# Patient Record
Sex: Male | Born: 1975 | Race: Black or African American | Hispanic: No | Marital: Single | Smoking: Current every day smoker
Health system: Southern US, Community
[De-identification: ages and names within clinical notes are randomized; demographics above are authoritative.]

## PROBLEM LIST (undated history)

## (undated) DIAGNOSIS — G4730 Sleep apnea, unspecified: Secondary | ICD-10-CM

## (undated) DIAGNOSIS — F200 Paranoid schizophrenia: Secondary | ICD-10-CM

## (undated) DIAGNOSIS — I10 Essential (primary) hypertension: Secondary | ICD-10-CM

## (undated) DIAGNOSIS — F1910 Other psychoactive substance abuse, uncomplicated: Secondary | ICD-10-CM

## (undated) DIAGNOSIS — K219 Gastro-esophageal reflux disease without esophagitis: Secondary | ICD-10-CM

## (undated) DIAGNOSIS — R7303 Prediabetes: Secondary | ICD-10-CM

## (undated) DIAGNOSIS — E7800 Pure hypercholesterolemia, unspecified: Secondary | ICD-10-CM

## (undated) DIAGNOSIS — E119 Type 2 diabetes mellitus without complications: Secondary | ICD-10-CM

## (undated) HISTORY — DX: Gastro-esophageal reflux disease without esophagitis: K21.9

## (undated) HISTORY — DX: Other psychoactive substance abuse, uncomplicated: F19.10

## (undated) HISTORY — DX: Type 2 diabetes mellitus without complications: E11.9

---

## 2007-04-03 ENCOUNTER — Emergency Department (HOSPITAL_COMMUNITY): Admission: EM | Admit: 2007-04-03 | Discharge: 2007-04-04 | Payer: Self-pay | Admitting: Emergency Medicine

## 2009-11-01 ENCOUNTER — Emergency Department (HOSPITAL_COMMUNITY): Admission: EM | Admit: 2009-11-01 | Discharge: 2009-11-01 | Payer: Self-pay | Admitting: Emergency Medicine

## 2011-11-09 ENCOUNTER — Other Ambulatory Visit: Payer: Self-pay

## 2013-09-12 ENCOUNTER — Emergency Department (HOSPITAL_COMMUNITY): Payer: Medicaid Other

## 2013-09-12 ENCOUNTER — Emergency Department (HOSPITAL_COMMUNITY)
Admission: EM | Admit: 2013-09-12 | Discharge: 2013-09-12 | Disposition: A | Discharge status: Home / Self Care | Payer: Medicaid Other | Attending: Emergency Medicine | Admitting: Emergency Medicine

## 2013-09-12 ENCOUNTER — Encounter (HOSPITAL_COMMUNITY): Payer: Self-pay | Admitting: Emergency Medicine

## 2013-09-12 DIAGNOSIS — I10 Essential (primary) hypertension: Secondary | ICD-10-CM | POA: Insufficient documentation

## 2013-09-12 DIAGNOSIS — Z79899 Other long term (current) drug therapy: Secondary | ICD-10-CM | POA: Insufficient documentation

## 2013-09-12 DIAGNOSIS — M549 Dorsalgia, unspecified: Secondary | ICD-10-CM | POA: Insufficient documentation

## 2013-09-12 DIAGNOSIS — E7800 Pure hypercholesterolemia, unspecified: Secondary | ICD-10-CM | POA: Insufficient documentation

## 2013-09-12 DIAGNOSIS — J209 Acute bronchitis, unspecified: Secondary | ICD-10-CM | POA: Insufficient documentation

## 2013-09-12 DIAGNOSIS — F200 Paranoid schizophrenia: Secondary | ICD-10-CM | POA: Insufficient documentation

## 2013-09-12 DIAGNOSIS — IMO0002 Reserved for concepts with insufficient information to code with codable children: Secondary | ICD-10-CM | POA: Insufficient documentation

## 2013-09-12 DIAGNOSIS — R911 Solitary pulmonary nodule: Secondary | ICD-10-CM

## 2013-09-12 DIAGNOSIS — F17200 Nicotine dependence, unspecified, uncomplicated: Secondary | ICD-10-CM | POA: Insufficient documentation

## 2013-09-12 DIAGNOSIS — Z8669 Personal history of other diseases of the nervous system and sense organs: Secondary | ICD-10-CM | POA: Insufficient documentation

## 2013-09-12 DIAGNOSIS — J40 Bronchitis, not specified as acute or chronic: Secondary | ICD-10-CM

## 2013-09-12 HISTORY — DX: Sleep apnea, unspecified: G47.30

## 2013-09-12 HISTORY — DX: Essential (primary) hypertension: I10

## 2013-09-12 HISTORY — DX: Pure hypercholesterolemia, unspecified: E78.00

## 2013-09-12 HISTORY — DX: Paranoid schizophrenia: F20.0

## 2013-09-12 HISTORY — DX: Prediabetes: R73.03

## 2013-09-12 LAB — CBC WITH DIFFERENTIAL/PLATELET
BASOS ABS: 0 10*3/uL (ref 0.0–0.1)
Basophils Relative: 0 % (ref 0–1)
EOS PCT: 2 % (ref 0–5)
Eosinophils Absolute: 0.3 10*3/uL (ref 0.0–0.7)
HEMATOCRIT: 45.6 % (ref 39.0–52.0)
Hemoglobin: 14.8 g/dL (ref 13.0–17.0)
LYMPHS ABS: 3.4 10*3/uL (ref 0.7–4.0)
LYMPHS PCT: 25 % (ref 12–46)
MCH: 27.7 pg (ref 26.0–34.0)
MCHC: 32.5 g/dL (ref 30.0–36.0)
MCV: 85.4 fL (ref 78.0–100.0)
MONOS PCT: 6 % (ref 3–12)
Monocytes Absolute: 0.8 10*3/uL (ref 0.1–1.0)
NEUTROS ABS: 9.1 10*3/uL — AB (ref 1.7–7.7)
Neutrophils Relative %: 67 % (ref 43–77)
Platelets: 317 10*3/uL (ref 150–400)
RBC: 5.34 MIL/uL (ref 4.22–5.81)
RDW: 14.1 % (ref 11.5–15.5)
WBC: 13.6 10*3/uL — AB (ref 4.0–10.5)

## 2013-09-12 LAB — BASIC METABOLIC PANEL
BUN: 16 mg/dL (ref 6–23)
CALCIUM: 10.4 mg/dL (ref 8.4–10.5)
CO2: 27 meq/L (ref 19–32)
Chloride: 102 mEq/L (ref 96–112)
Creatinine, Ser: 1.1 mg/dL (ref 0.50–1.35)
GFR calc Af Amer: >90 mL/min (ref >90)
GFR, EST NON AFRICAN AMERICAN: 83 mL/min — AB (ref >90)
Glucose, Bld: 89 mg/dL (ref 70–99)
POTASSIUM: 4.2 meq/L (ref 3.7–5.3)
SODIUM: 142 meq/L (ref 137–147)

## 2013-09-12 LAB — D-DIMER, QUANTITATIVE (NOT AT ARMC): D DIMER QUANT: 2.07 ug{FEU}/mL — AB (ref 0.00–0.48)

## 2013-09-12 MED ORDER — SODIUM CHLORIDE 0.9 % IJ SOLN
INTRAMUSCULAR | Status: AC
  Filled 2013-09-12: qty 250

## 2013-09-12 MED ORDER — PREDNISONE 20 MG PO TABS: ORAL_TABLET | ORAL | Status: DC

## 2013-09-12 MED ORDER — AZITHROMYCIN 250 MG PO TABS: 250.0000 mg | ORAL_TABLET | Freq: Every day | ORAL | Status: DC

## 2013-09-12 MED ORDER — IOHEXOL 350 MG/ML SOLN
100.0000 mL | Freq: Once | INTRAVENOUS | Status: AC | PRN
  Administered 2013-09-12: 100 mL via INTRAVENOUS

## 2013-09-12 NOTE — Discharge Instructions (Signed)
Please call your doctor for a followup appointment within 24-48 hours. When you talk to your doctor please let them know that you were seen in the emergency department and have them acquire all of your records so that they can discuss the findings with you and formulate a treatment plan to fully care for your new and ongoing problems. Please take medications as prescribed - azithromycin is an antibiotic and prednisone is a steroid to aid in the reduction of swelling. The blood in your phlegm is most likely from bronchitis - inflammation of the airways of your lungs.  Please continue to rest and stay hydrated.  On CT chest there was a small nodule, 6 mm, identified in the left upper lobe - highly recommend to discuss this with your primary care provider. Highly recommend to follow-up with pulmonologist regarding this finding and for a CT/MR to be repeated within 6-12 months to see if the nodule has changed in size Please try to quit smoking Please continue to monitor symptoms closely and if symptoms are to worsen or change (fever greater than 101, chills, neck pain, chest pain, shortness of breath, difficulty breathing, stomach pain, nausea, vomiting, changes to colors of the sputum/phlegm, mucus becomes bright red with every cough, changes to voice, headache, confusion, blurred vision) please report back to emergency department immediately  Bronchitis Bronchitis is inflammation of the airways that extend from the windpipe into the lungs (bronchi). The inflammation often causes mucus to develop, which leads to a cough. If the inflammation becomes severe, it may cause shortness of breath. CAUSES  Bronchitis may be caused by:   Viral infections.   Bacteria.   Cigarette smoke.   Allergens, pollutants, and other irritants.  SIGNS AND SYMPTOMS  The most common symptom of bronchitis is a frequent cough that produces mucus. Other symptoms include:  Fever.   Body aches.   Chest congestion.    Chills.   Shortness of breath.   Sore throat.  DIAGNOSIS  Bronchitis is usually diagnosed through a medical history and physical exam. Tests, such as chest X-rays, are sometimes done to rule out other conditions.  TREATMENT  You may need to avoid contact with whatever caused the problem (smoking, for example). Medicines are sometimes needed. These may include:  Antibiotics. These may be prescribed if the condition is caused by bacteria.  Cough suppressants. These may be prescribed for relief of cough symptoms.   Inhaled medicines. These may be prescribed to help open your airways and make it easier for you to breathe.   Steroid medicines. These may be prescribed for those with recurrent (chronic) bronchitis. HOME CARE INSTRUCTIONS  Get plenty of rest.   Drink enough fluids to keep your urine clear or pale yellow (unless you have a medical condition that requires fluid restriction). Increasing fluids may help thin your secretions and will prevent dehydration.   Only take over-the-counter or prescription medicines as directed by your health care provider.  Only take antibiotics as directed. Make sure you finish them even if you start to feel better.  Avoid secondhand smoke, irritating chemicals, and strong fumes. These will make bronchitis worse. If you are a smoker, quit smoking. Consider using nicotine gum or skin patches to help control withdrawal symptoms. Quitting smoking will help your lungs heal faster.   Put a cool-mist humidifier in your bedroom at night to moisten the air. This may help loosen mucus. Change the water in the humidifier daily. You can also run the hot water in your  shower and sit in the bathroom with the door closed for 5 10 minutes.   Follow up with your health care provider as directed.   Wash your hands frequently to avoid catching bronchitis again or spreading an infection to others.  SEEK MEDICAL CARE IF: Your symptoms do not improve  after 1 week of treatment.  SEEK IMMEDIATE MEDICAL CARE IF:  Your fever increases.  You have chills.   You have chest pain.   You have worsening shortness of breath.   You have bloody sputum.  You faint.  You have lightheadedness.  You have a severe headache.   You vomit repeatedly. MAKE SURE YOU:   Understand these instructions.  Will watch your condition.  Will get help right away if you are not doing well or get worse. Document Released: 08/10/2005 Document Revised: 05/31/2013 Document Reviewed: 04/04/2013 [facility] Patient Information 2014 [place].  Pulmonary Nodule A pulmonary nodule is a small, round growth of tissue in the lung. Pulmonary nodules can range in size from less than 1/5 inch (4 mm) to a little bigger than an inch (25 mm). Most pulmonary nodules are detected when imaging tests of the lung are being performed for a different problem. Pulmonary nodules are usually not cancerous (benign). However, some pulmonary nodules are cancerous (malignant). Follow-up treatment or testing is based on the size of the pulmonary nodule and your risk of getting lung cancer.  CAUSES Benign pulmonary nodules can be caused by various things. Some of the causes include:   Bacterial, fungal, or viral infections. This is usually an old infection that is no longer active, but it can sometimes be a current, active infection.  A benign mass of tissue.  Inflammation from conditions such as rheumatoid arthritis.   Abnormal blood vessels in the lungs. Malignant pulmonary nodules can result from lung cancer or from cancers that spread to the lung from other places in the body. SIGNS AND SYMPTOMS Pulmonary nodules usually do not cause symptoms. DIAGNOSIS Most often, pulmonary nodules are found incidentally when an X-ray or CT scan is performed to look for some other problem in the lung area. To help determine whether a pulmonary nodule is benign or malignant, your health  care provider will take a medical history and order a variety of tests. Tests done may include:   Blood tests.  A skin test called a tuberculin test. This test is used to determine if you have been exposed to the germ that causes tuberculosis.   Chest X-rays. If possible, a new X-ray may be compared with X-rays you have had in the past.   CT scan. This test shows smaller pulmonary nodules more clearly than an X-ray.   Positron emission tomography (PET) scan. In this test, a safe amount of a radioactive substance is injected into the bloodstream. Then, the scan takes a picture of the pulmonary nodule. The radioactive substance is eliminated from your body in your urine.   Biopsy. A tiny piece of the pulmonary nodule is removed so it can be checked under a microscope. TREATMENT  Pulmonary nodules that are benign normally do not require any treatment because they usually do not cause symptoms or breathing problems. Your health care provider may want to monitor the pulmonary nodule through follow-up CT scans. The frequency of these CT scans will vary based on the size of the nodule and the risk factors for lung cancer. For example, CT scans will need to be done more frequently if the pulmonary nodule is  larger and if you have a history of smoking and a family history of cancer. Further testing or biopsies may be done if any follow-up CT scan shows that the size of the pulmonary nodule has increased. HOME CARE INSTRUCTIONS  Only take over-the-counter or prescription medicines as directed by your health care provider.  Keep all follow-up appointments with your health care provider. SEEK MEDICAL CARE IF:  You have trouble breathing when you are active.   You feel sick or unusually tired.   You do not feel like eating.   You lose weight without trying to.   You develop chills or night sweats.  SEEK IMMEDIATE MEDICAL CARE IF:  You cannot catch your breath, or you begin wheezing.    You cannot stop coughing.   You cough up blood.   You become dizzy or feel like you are going to pass out.   You have sudden chest pain.   You have a fever or persistent symptoms for more than 2 3 days.   You have a fever and your symptoms suddenly get worse. MAKE SURE YOU:  Understand these instructions.  Will watch your condition.  Will get help right away if you are not doing well or get worse. Document Released: 06/07/2009 Document Revised: 04/12/2013 Document Reviewed: 01/30/2013 [facility] Patient Information 2014 [place].  RESOURCE GUIDE  Chronic Pain Problems:  Contact [facility] [phone]  Patients need to be referred by their primary care doctor.  Insufficient Money for Medicine:  Contact [employer]: call "211" or [place] [phone].  No Primary Care Doctor:    Call [facility] [phone] - can help you locate a primary care doctor that accepts your insurance, provides certain services, etc.    Physician Referral Service- [phone] Agencies that provide inexpensive medical care:    [facility] [phone]    [facility] [phone]    [facility] [phone]    [facility] [phone]    [facility] [phone] [facility] Primary Care Doctor List  Sinda Du MD. Specialty: Pulmonary Disease Contact information: [place]  [place] [address]  [phone]   Tula Nakayama, MD. Specialty: Family Medicine Contact information: [address]  [place] [address]  [phone]   Sallee Lange, MD. Specialty: Family Medicine Contact information: [place]  [place] [address]  [phone]   Rosita Fire, MD Specialty: Internal Medicine Contact information: [place] [address]  [phone]   Delphina Cahill, MD. Specialty: Internal Medicine  Contact information: [place] [address]  [phone]   Marjean Donna, MD. Specialty: Family Medicine Contact information: [place] [address]  [phone]   Leslie Andrea, MD. Specialty: [facility] Contact information: [place] [address]  [phone]   Asencion Noble, MD. Specialty: Internal Medicine Contact information: [address]  [place] [address]  [phone]

## 2013-09-12 NOTE — ED Notes (Signed)
Alert, says has expectorated blood

## 2013-09-12 NOTE — ED Notes (Signed)
Cough for 2 weeks, says he expectorated blood for last 3 days.

## 2013-09-12 NOTE — ED Provider Notes (Signed)
CSN: [id number]     Arrival date & time 09/12/13  1435 History   First MD Initiated Contact with Patient 09/12/13 1505     Chief Complaint  Patient presents with  . Cough   (Consider location/radiation/quality/duration/timing/severity/associated sxs/prior Treatment) The history is provided by the patient. No language interpreter was used.  Daniel Arias is a 38 year old male with past medical history of hypertension, hypercholesterolemia, paranoid schizophrenia presenting to the emergency department with cough, nasal congestion, myalgias the been ongoing for the past 2 weeks. Patient reported that his cough started out as a clear phlegm and has now turned into a "normal red" tinge. Patient reports he notices this red tinge phlegm mainly in the mornings and late at night. Patient reported that he has been having sweats-reported that it is been continuous for the past couple of nights, thinks this is from his new comforter. Reported generalized bodyaches, localized to the lower back described as an aching sensation that is constant. Patient reported that his little sister is sick as well with cold-like symptoms. Denied nausea, vomiting, diarrhea, abdominal pain, neck pain, neck stiffness, blurred vision, sudden loss of vision, dizziness, headache, chest pain, shortness of breath, difficulty breathing, fever, sore throat, difficulty swallowing, melena, hematochezia, urinary symptoms, hematuria, ear pain, eye complaints. PCP Dr. Octavia Bruckner  Past Medical History  Diagnosis Date  . Hypertension   . Hypercholesteremia   . Schizophrenia, paranoid   . Prediabetes   . Sleep apnea    History reviewed. No pertinent past surgical history. History reviewed. No pertinent family history. History  Substance Use Topics  . Smoking status: Current Every Day Smoker  . Smokeless tobacco: Not on file  . Alcohol Use: Yes    Review of Systems  Constitutional: Positive for fever and chills.  HENT: Negative for sore  throat and trouble swallowing.   Eyes: Negative for visual disturbance.  Respiratory: Positive for cough. Negative for chest tightness and shortness of breath.   Cardiovascular: Negative for chest pain.  Gastrointestinal: Negative for nausea, vomiting, abdominal pain, diarrhea, constipation and blood in stool.  Musculoskeletal: Positive for back pain and myalgias. Negative for neck pain.  Neurological: Negative for dizziness, weakness, numbness and headaches.  All other systems reviewed and are negative.    Allergies  Review of patient's allergies indicates no known allergies.  Home Medications   Current Outpatient Rx  Name  Route  Sig  Dispense  Refill  . ARIPiprazole (ABILIFY) 30 MG tablet   Oral   Take 30 mg by mouth daily.         . fenofibrate (TRICOR) 145 MG tablet   Oral   Take 145 mg by mouth daily.         Marland Kitchen lisinopril (PRINIVIL,ZESTRIL) 40 MG tablet   Oral   Take 40 mg by mouth daily.         . Omega-3 Fatty Acids (FISH OIL) 1000 MG CAPS   Oral   Take 1 capsule by mouth daily.         Marland Kitchen azithromycin (ZITHROMAX) 250 MG tablet   Oral   Take 1 tablet (250 mg total) by mouth daily. Take first 2 tablets together, then 1 every day until finished.   6 tablet   0   . predniSONE (DELTASONE) 20 MG tablet      3 tabs po day one, then 2 tabs daily x 4 days   11 tablet   0    BP 110/64  Pulse 107  Temp(Src)  99 F (37.2 C) (Oral)  Resp 20  Ht 6' 2"  (1.88 m)  Wt 312 lb (141.522 kg)  BMI 40.04 kg/m2  SpO2 95% Physical Exam  Nursing note and vitals reviewed. Constitutional: He is oriented to person, place, and time. He appears well-developed and well-nourished. No distress.  HENT:  Head: Normocephalic and atraumatic.  Mouth/Throat: Oropharynx is clear and moist. No oropharyngeal exudate.  Negative swelling, erythema, inflammation, lesions, sores, exudate, petechiae noted to the posterior oropharynx and tonsils bilaterally. Uvula midline, symmetrical  elevation. Negative uvula swelling  Eyes: Conjunctivae and EOM are normal. Pupils are equal, round, and reactive to light. Right eye exhibits no discharge. Left eye exhibits no discharge.  Neck: Normal range of motion. Neck supple. No tracheal deviation present.  Cardiovascular: Normal rate, regular rhythm and normal heart sounds.  Exam reveals no friction rub.   No murmur heard. Cap refill less than 3 seconds Negative swelling localized to the lower extremities Negative pitting edema identified  Pulmonary/Chest: Effort normal and breath sounds normal. No respiratory distress. He has no wheezes. He has no rales.  Airway intact Negative stridor Negative use of accessory muscles Patient stable speaking full sentences without difficulty  Musculoskeletal: Normal range of motion.  Full ROM to upper and lower extremities without difficulty noted, negative ataxia noted.  Lymphadenopathy:    He has no cervical adenopathy.  Neurological: He is alert and oriented to person, place, and time. No cranial nerve deficit. He exhibits normal muscle tone. Coordination normal.  Cranial nerves III-XII grossly intact  Skin: Skin is warm and dry. No rash noted. He is not diaphoretic. No erythema.  Psychiatric:  Flat affect    ED Course  Procedures (including critical care time)  6:13 PM This provider had a long discussion with the patient regarding labs and imaging results. Discussed with the patient importance of smoking cessation. Discussed with the patient the importance of following up with his primary care provider and pulmonologist. Discussed with patient the need for a CT/MR to be performed within 6-12 months to identify the status of the nodule.   Results for orders placed during the hospital encounter of 09/12/13  CBC WITH DIFFERENTIAL      Result Value Range   WBC 13.6 (*) 4.0 - 10.5 K/uL   RBC 5.34  4.22 - 5.81 MIL/uL   Hemoglobin 14.8  13.0 - 17.0 g/dL   HCT 45.6  39.0 - 52.0 %   MCV 85.4   78.0 - 100.0 fL   MCH 27.7  26.0 - 34.0 pg   MCHC 32.5  30.0 - 36.0 g/dL   RDW 14.1  11.5 - 15.5 %   Platelets 317  150 - 400 K/uL   Neutrophils Relative % 67  43 - 77 %   Lymphocytes Relative 25  12 - 46 %   Monocytes Relative 6  3 - 12 %   Eosinophils Relative 2  0 - 5 %   Basophils Relative 0  0 - 1 %   Neutro Abs 9.1 (*) 1.7 - 7.7 K/uL   Lymphs Abs 3.4  0.7 - 4.0 K/uL   Monocytes Absolute 0.8  0.1 - 1.0 K/uL   Eosinophils Absolute 0.3  0.0 - 0.7 K/uL   Basophils Absolute 0.0  0.0 - 0.1 K/uL   RBC Morphology POLYCHROMASIA PRESENT     WBC Morphology ATYPICAL LYMPHOCYTES     Smear Review LARGE PLATELETS PRESENT    BASIC METABOLIC PANEL      Result Value  Range   Sodium 142  137 - 147 mEq/L   Potassium 4.2  3.7 - 5.3 mEq/L   Chloride 102  96 - 112 mEq/L   CO2 27  19 - 32 mEq/L   Glucose, Bld 89  70 - 99 mg/dL   BUN 16  6 - 23 mg/dL   Creatinine, Ser 1.10  0.50 - 1.35 mg/dL   Calcium 10.4  8.4 - 10.5 mg/dL   GFR calc non Af Amer 83 (*) >90 mL/min   GFR calc Af Amer >90  >90 mL/min  D-DIMER, QUANTITATIVE      Result Value Range   D-Dimer, Quant 2.07 (*) 0.00 - 0.48 ug/mL-FEU   Dg Chest 2 View  09/12/2013   CLINICAL DATA:  Cough and hemoptysis  EXAM: CHEST  2 VIEW  COMPARISON:  None.  FINDINGS: Lungs are clear. Heart size and pulmonary vascularity are normal. No adenopathy. No bone lesions.  IMPRESSION: No abnormality noted. If hemoptysis persists, correlation with chest CT or bronchoscopy may be reasonable.   Electronically Signed   By: Lowella Grip M.D.   On: 09/12/2013 15:50   Ct Angio Chest Pe W/cm &/or Wo Cm  09/12/2013   CLINICAL DATA:  Coughing up blood, cough  EXAM: CT ANGIOGRAPHY CHEST WITH CONTRAST  TECHNIQUE: Multidetector CT imaging of the chest was performed using the standard protocol during bolus administration of intravenous contrast. Multiplanar CT image reconstructions including MIPs were obtained to evaluate the vascular anatomy.  CONTRAST:  134m OMNIPAQUE  IOHEXOL 350 MG/ML SOLN  COMPARISON:  Prior radiograph from earlier the same day and  FINDINGS: No pathologically enlarged mediastinal, hilar, or axillary lymph nodes are identified.  The aorta and great vessels demonstrate a normal contrast enhanced appearance. Heart size is within normal limits. Trace pericardial fluid noted.  Pulmonary arterial tree is well opacified. No filling defect to suggest acute pulmonary embolism identified. Re-formatted imaging confirms these findings.  Lungs are clear without focal infiltrate, pulmonary edema, or pleural effusion. There is no pneumothorax. A 6 mm nodule is present along the major fissure in the posterior left upper lobe (series 5, image 51).  Visualized portions of the upper abdomen are unremarkable.  No focal lytic or blastic osseous lesion identified. No acute osseous abnormality.  IMPRESSION: 1. No CT evidence of acute pulmonary embolism. 2. 6 mm nodule along the left major fissure an down left upper lobe. If the patient is at high risk for bronchogenic carcinoma, follow-up chest CT at 6-12 months is recommended. If the patient is at low risk for bronchogenic carcinoma, follow-up chest CT at 12 months is recommended. This recommendation follows the consensus statement: Guidelines for Management of Small Pulmonary Nodules Detected on CT Scans: A Statement from the [place] published in Radiology 2005;237:395-400.   Electronically Signed   By: BJeannine BogaM.D.   On: 09/12/2013 17:44   Labs Review Labs Reviewed  CBC WITH DIFFERENTIAL - Abnormal; Notable for the following:    WBC 13.6 (*)    Neutro Abs 9.1 (*)    All other components within normal limits  BASIC METABOLIC PANEL - Abnormal; Notable for the following:    GFR calc non Af Amer 83 (*)    All other components within normal limits  D-DIMER, QUANTITATIVE - Abnormal; Notable for the following:    D-Dimer, Quant 2.07 (*)    All other components within normal limits   Imaging  Review Dg Chest 2 View  09/12/2013   CLINICAL DATA:  Cough and hemoptysis  EXAM: CHEST  2 VIEW  COMPARISON:  None.  FINDINGS: Lungs are clear. Heart size and pulmonary vascularity are normal. No adenopathy. No bone lesions.  IMPRESSION: No abnormality noted. If hemoptysis persists, correlation with chest CT or bronchoscopy may be reasonable.   Electronically Signed   By: Lowella Grip M.D.   On: 09/12/2013 15:50   Ct Angio Chest Pe W/cm &/or Wo Cm  09/12/2013   CLINICAL DATA:  Coughing up blood, cough  EXAM: CT ANGIOGRAPHY CHEST WITH CONTRAST  TECHNIQUE: Multidetector CT imaging of the chest was performed using the standard protocol during bolus administration of intravenous contrast. Multiplanar CT image reconstructions including MIPs were obtained to evaluate the vascular anatomy.  CONTRAST:  135m OMNIPAQUE IOHEXOL 350 MG/ML SOLN  COMPARISON:  Prior radiograph from earlier the same day and  FINDINGS: No pathologically enlarged mediastinal, hilar, or axillary lymph nodes are identified.  The aorta and great vessels demonstrate a normal contrast enhanced appearance. Heart size is within normal limits. Trace pericardial fluid noted.  Pulmonary arterial tree is well opacified. No filling defect to suggest acute pulmonary embolism identified. Re-formatted imaging confirms these findings.  Lungs are clear without focal infiltrate, pulmonary edema, or pleural effusion. There is no pneumothorax. A 6 mm nodule is present along the major fissure in the posterior left upper lobe (series 5, image 51).  Visualized portions of the upper abdomen are unremarkable.  No focal lytic or blastic osseous lesion identified. No acute osseous abnormality.  IMPRESSION: 1. No CT evidence of acute pulmonary embolism. 2. 6 mm nodule along the left major fissure an down left upper lobe. If the patient is at high risk for bronchogenic carcinoma, follow-up chest CT at 6-12 months is recommended. If the patient is at low risk for  bronchogenic carcinoma, follow-up chest CT at 12 months is recommended. This recommendation follows the consensus statement: Guidelines for Management of Small Pulmonary Nodules Detected on CT Scans: A Statement from the [place] published in Radiology 2005;237:395-400.   Electronically Signed   By: BJeannine BogaM.D.   On: 09/12/2013 17:44    EKG Interpretation   None       MDM   1. Bronchitis   2. Lung nodule seen on imaging study     Medications  sodium chloride 0.9 % injection (not administered)  iohexol (OMNIPAQUE) 350 MG/ML injection 100 mL (100 mLs Intravenous Contrast Given 09/12/13 1717)   Filed Vitals:   09/12/13 1454  BP: 110/64  Pulse: 107  Temp: 99 F (37.2 C)  TempSrc: Oral  Resp: 20  Height: 6' 2"  (1.88 m)  Weight: 312 lb (141.522 kg)  SpO2: 95%    Patient presents to the emergency department with a cough that has been ongoing for the past 2 weeks. Patient reported that over the past 2 days-since Friday he has noticed that the phlegm is another red tinged color. Reported that the red tinged phlegm mainly occurs early in the morning and late at night. Reported associated symptoms of sweating mainly at night-reported that he does not have to change his clothing. Alert and oriented. GCS 15. Heart rate and rhythm normal. Lungs clear to auscultation. Negative neck stiffness. Negative nuchal rigidity. Negative cervical lymphadenopathy. Cap refill less than 3 seconds. Radial pulses and DP pulses 2+ bilaterally. Negative leg swelling or pitting edema identified bilaterally. Negative meningeal signs. Full range of motion to upper and lower extremities bilaterally without difficulty noted or ataxia. Strength intact. Sensation intact.  CBC noted mild elevation of white blood cell count-13.6 with negative leukocytosis identified. BMP negative findings. D-dimer elevated at 2.07. Chest x-ray negative abnormalities identified. CT angio chest negative evidence of  acute pulmonary embolism. On CT of chest 6 mm nodule along the left major fissure of left upper lobe identified-recommended CT/MR to be performed at 6-12 months. Negative findings of PE. Doubt tuberculosis. Suspicion to be possible bronchitis-cannot rule out possible beginnings of bronchogenic carcinoma. Discussed lab findings and imaging with patient in great detail. Discussed with patient and referred patient to primary care provider to be reassessed within 24-48 hours. Referred patient to primary care provider and pulmonologist. Discharged patient azithromycin and prednisone.  Discussed with patient to rest and stay hydrated. Discussed with patient to closely monitor symptoms and if symptoms are to worsen or change to report back to the ED - strict return instructions given.  Patient agreed to plan of care, understood, all questions answered.   Jamse Mead, PA-C 09/13/13 2010

## 2013-09-14 NOTE — ED Provider Notes (Signed)
Medical screening examination/treatment/procedure(s) were performed by non-physician practitioner and as supervising physician I was immediately available for consultation/collaboration.  EKG Interpretation   None         Sharyon Cable, MD 09/14/13 [phone]

## 2014-03-04 ENCOUNTER — Encounter (HOSPITAL_COMMUNITY): Payer: Self-pay | Admitting: Emergency Medicine

## 2014-03-04 ENCOUNTER — Emergency Department (HOSPITAL_COMMUNITY): Payer: Medicaid Other

## 2014-03-04 ENCOUNTER — Emergency Department (HOSPITAL_COMMUNITY)
Admission: EM | Admit: 2014-03-04 | Discharge: 2014-03-04 | Disposition: A | Discharge status: Home / Self Care | Payer: Medicaid Other | Attending: Emergency Medicine | Admitting: Emergency Medicine

## 2014-03-04 DIAGNOSIS — S93409A Sprain of unspecified ligament of unspecified ankle, initial encounter: Secondary | ICD-10-CM | POA: Insufficient documentation

## 2014-03-04 DIAGNOSIS — F200 Paranoid schizophrenia: Secondary | ICD-10-CM | POA: Diagnosis not present

## 2014-03-04 DIAGNOSIS — Y9239 Other specified sports and athletic area as the place of occurrence of the external cause: Secondary | ICD-10-CM | POA: Diagnosis not present

## 2014-03-04 DIAGNOSIS — E7800 Pure hypercholesterolemia, unspecified: Secondary | ICD-10-CM | POA: Insufficient documentation

## 2014-03-04 DIAGNOSIS — S99919A Unspecified injury of unspecified ankle, initial encounter: Secondary | ICD-10-CM | POA: Diagnosis present

## 2014-03-04 DIAGNOSIS — Y9364 Activity, baseball: Secondary | ICD-10-CM | POA: Insufficient documentation

## 2014-03-04 DIAGNOSIS — Y92838 Other recreation area as the place of occurrence of the external cause: Secondary | ICD-10-CM | POA: Diagnosis not present

## 2014-03-04 DIAGNOSIS — S93401A Sprain of unspecified ligament of right ankle, initial encounter: Secondary | ICD-10-CM

## 2014-03-04 DIAGNOSIS — F17200 Nicotine dependence, unspecified, uncomplicated: Secondary | ICD-10-CM | POA: Diagnosis not present

## 2014-03-04 DIAGNOSIS — Z79899 Other long term (current) drug therapy: Secondary | ICD-10-CM | POA: Diagnosis not present

## 2014-03-04 DIAGNOSIS — I10 Essential (primary) hypertension: Secondary | ICD-10-CM | POA: Insufficient documentation

## 2014-03-04 DIAGNOSIS — S8990XA Unspecified injury of unspecified lower leg, initial encounter: Secondary | ICD-10-CM | POA: Diagnosis present

## 2014-03-04 DIAGNOSIS — X58XXXA Exposure to other specified factors, initial encounter: Secondary | ICD-10-CM | POA: Insufficient documentation

## 2014-03-04 MED ORDER — NAPROXEN 500 MG PO TABS: 500.0000 mg | ORAL_TABLET | Freq: Two times a day (BID) | ORAL | Status: DC

## 2014-03-04 MED ORDER — HYDROCODONE-ACETAMINOPHEN 5-325 MG PO TABS: ORAL_TABLET | ORAL | Status: DC

## 2014-03-04 NOTE — ED Notes (Signed)
Patient verbalizes understanding of discharge instructions, prescription medications, home care, and follow up information. Patient ambulatory out of department at this time

## 2014-03-04 NOTE — ED Notes (Signed)
Patient complaining of pain to lateral right ankle. Pt states he was playing softball yesterday and hurt it. + pedal pulse to affected limb.

## 2014-03-04 NOTE — ED Notes (Signed)
Playing softball yesterday and states ankle or arch to right foot began hurting. Constant pain when walking. No pain when off his feet.

## 2014-03-04 NOTE — Discharge Instructions (Signed)
Ankle Sprain An ankle sprain is an injury to the strong, fibrous tissues (ligaments) that hold your ankle bones together.  HOME CARE   Put ice on your ankle for 1-2 days or as told by your doctor.  Put ice in a plastic bag.  Place a towel between your skin and the bag.  Leave the ice on for 15-20 minutes at a time, every 2 hours while you are awake.  Only take medicine as told by your doctor.  Raise (elevate) your injured ankle above the level of your heart as much as possible for 2-3 days.  Use crutches if your doctor tells you to. Slowly put your own weight on the affected ankle. Use the crutches until you can walk without pain.  If you have a plaster splint:  Do not rest it on anything harder than a pillow for 24 hours.  Do not put weight on it.  Do not get it wet.  Take it off to shower or bathe.  If given, use an elastic wrap or support stocking for support. Take the wrap off if your toes lose feeling (numb), tingle, or turn cold or blue.  If you have an air splint:  Add or let out air to make it comfortable.  Take it off at night and to shower and bathe.  Wiggle your toes and move your ankle up and down often while you are wearing it. GET HELP RIGHT AWAY IF:   Your toes lose feeling (numb) or turn blue.  You have severe pain that is increasing.  You have rapidly increasing bruising or puffiness (swelling).  Your toes feel very cold.  You lose feeling in your foot.  Your medicine does not help your pain. MAKE SURE YOU:   Understand these instructions.  Will watch your condition.  Will get help right away if you are not doing well or get worse. Document Released: 01/27/2008 Document Revised: 05/04/2012 Document Reviewed: 02/22/2012 [facility] Patient Information 2015 [place], [place]. This information is not intended to replace advice given to you by your health care provider. Make sure you discuss any questions you have with your health care provider.

## 2014-03-07 NOTE — ED Provider Notes (Signed)
CSN: [id number]     Arrival date & time 03/04/14  2054 History   First MD Initiated Contact with Patient 03/04/14 2141     Chief Complaint  Patient presents with  . Ankle Pain     (Consider location/radiation/quality/duration/timing/severity/associated sxs/prior Treatment) Patient is a 38 y.o. male presenting with ankle pain. The history is provided by the patient.  Ankle Pain Location:  Ankle Time since incident:  1 day Injury: yes   Mechanism of injury comment:  Twisting Ankle location:  R ankle Pain details:    Quality:  Aching   Radiates to:  Does not radiate   Severity:  Mild   Onset quality:  Sudden   Timing:  Constant   Progression:  Unchanged Chronicity:  New Dislocation: no   Prior injury to area:  No Relieved by:  Rest Worsened by:  Activity and bearing weight Ineffective treatments:  None tried Associated symptoms: no back pain, no decreased ROM, no fever, no itching, no neck pain, no numbness, no stiffness, no swelling and no tingling     Past Medical History  Diagnosis Date  . Hypertension   . Hypercholesteremia   . Schizophrenia, paranoid   . Prediabetes   . Sleep apnea    History reviewed. No pertinent past surgical history. History reviewed. No pertinent family history. History  Substance Use Topics  . Smoking status: Current Every Day Smoker  . Smokeless tobacco: Not on file  . Alcohol Use: Yes    Review of Systems  Constitutional: Negative for fever and chills.  Genitourinary: Negative for dysuria and difficulty urinating.  Musculoskeletal: Positive for arthralgias. Negative for back pain, joint swelling, neck pain and stiffness.       Right ankle pain  Skin: Negative for color change, itching and wound.  All other systems reviewed and are negative.     Allergies  Review of patient's allergies indicates no known allergies.  Home Medications   Prior to Admission medications   Medication Sig Start Date End Date Taking? Authorizing  Provider  ARIPiprazole (ABILIFY) 30 MG tablet Take 30 mg by mouth daily.   Yes Historical Provider, MD  fenofibrate (TRICOR) 145 MG tablet Take 145 mg by mouth daily.   Yes Historical Provider, MD  hydrochlorothiazide (HYDRODIURIL) 25 MG tablet Take 25 mg by mouth daily.   Yes Historical Provider, MD  lisinopril (PRINIVIL,ZESTRIL) 40 MG tablet Take 40 mg by mouth daily.   Yes Historical Provider, MD  HYDROcodone-acetaminophen (NORCO/VICODIN) 5-325 MG per tablet Take one-two tabs po q 4-6 hrs prn pain 03/04/14   Travone Georg L. Kell Ferris, PA-C  naproxen (NAPROSYN) 500 MG tablet Take 1 tablet (500 mg total) by mouth 2 (two) times daily. 03/04/14   Lowry Bala L. Delontae Lamm, PA-C   BP 148/73  Pulse 88  Temp(Src) 98.1 F (36.7 C) (Oral)  Resp 20  Ht 6' 2"  (1.88 m)  Wt 313 lb (141.976 kg)  BMI 40.17 kg/m2  SpO2 97% Physical Exam  Nursing note and vitals reviewed. Constitutional: He is oriented to person, place, and time. He appears well-developed and well-nourished. No distress.  HENT:  Head: Normocephalic and atraumatic.  Cardiovascular: Normal rate, regular rhythm, normal heart sounds and intact distal pulses.   Pulmonary/Chest: Effort normal and breath sounds normal.  Musculoskeletal: He exhibits tenderness. He exhibits no edema.  Right lateral ankle is ttp, no STS is present.  ROM is preserved.  DP pulse is brisk,distal sensation intact.  No erythema, abrasion, bruising or bony deformity.  No proximal tenderness.  Neurological: He is alert and oriented to person, place, and time. He exhibits normal muscle tone. Coordination normal.  Skin: Skin is warm and dry.    ED Course  Procedures (including critical care time) Labs Review Labs Reviewed - No data to display  Imaging Review Dg Foot Complete Right  03/04/2014   CLINICAL DATA:  Injury.  Right foot pain.  EXAM: RIGHT FOOT COMPLETE - 3+ VIEW  COMPARISON:  None.  FINDINGS: Imaged bones, joints and soft tissues appear normal.  IMPRESSION: Negative  exam.   Electronically Signed   By: Inge Rise M.D.   On: 03/04/2014 21:26     EKG Interpretation None      MDM   Final diagnoses:  Ankle sprain, right, initial encounter    Pt with localized ttp of the lateral right foot and distal ankle.  No fx.  ASO applied for comfort.  Pt agrees to RICE therapy and given referral for orthopedics.  Pt stable for d/c    Tekla Malachowski L. Vanessa [place], PA-C 03/07/14 1809

## 2014-03-08 NOTE — ED Provider Notes (Signed)
Medical screening examination/treatment/procedure(s) were performed by non-physician practitioner and as supervising physician I was immediately available for consultation/collaboration.   EKG Interpretation None        Maudry Diego, MD 03/08/14 1616

## 2014-05-14 ENCOUNTER — Encounter (HOSPITAL_COMMUNITY): Payer: Self-pay | Admitting: Emergency Medicine

## 2014-05-14 ENCOUNTER — Emergency Department (HOSPITAL_COMMUNITY)
Admission: EM | Admit: 2014-05-14 | Discharge: 2014-05-15 | Disposition: A | Discharge status: Home / Self Care | Payer: Medicaid Other | Attending: Emergency Medicine | Admitting: Emergency Medicine

## 2014-05-14 DIAGNOSIS — E7800 Pure hypercholesterolemia, unspecified: Secondary | ICD-10-CM | POA: Insufficient documentation

## 2014-05-14 DIAGNOSIS — X58XXXA Exposure to other specified factors, initial encounter: Secondary | ICD-10-CM | POA: Diagnosis not present

## 2014-05-14 DIAGNOSIS — S139XXA Sprain of joints and ligaments of unspecified parts of neck, initial encounter: Secondary | ICD-10-CM | POA: Diagnosis not present

## 2014-05-14 DIAGNOSIS — M542 Cervicalgia: Secondary | ICD-10-CM | POA: Diagnosis present

## 2014-05-14 DIAGNOSIS — Y929 Unspecified place or not applicable: Secondary | ICD-10-CM | POA: Diagnosis not present

## 2014-05-14 DIAGNOSIS — Z8659 Personal history of other mental and behavioral disorders: Secondary | ICD-10-CM | POA: Insufficient documentation

## 2014-05-14 DIAGNOSIS — Z79899 Other long term (current) drug therapy: Secondary | ICD-10-CM | POA: Insufficient documentation

## 2014-05-14 DIAGNOSIS — F17200 Nicotine dependence, unspecified, uncomplicated: Secondary | ICD-10-CM | POA: Diagnosis not present

## 2014-05-14 DIAGNOSIS — I10 Essential (primary) hypertension: Secondary | ICD-10-CM | POA: Diagnosis not present

## 2014-05-14 DIAGNOSIS — Z791 Long term (current) use of non-steroidal anti-inflammatories (NSAID): Secondary | ICD-10-CM | POA: Insufficient documentation

## 2014-05-14 DIAGNOSIS — Y939 Activity, unspecified: Secondary | ICD-10-CM | POA: Diagnosis not present

## 2014-05-14 DIAGNOSIS — S161XXA Strain of muscle, fascia and tendon at neck level, initial encounter: Secondary | ICD-10-CM

## 2014-05-14 MED ORDER — KETOROLAC TROMETHAMINE 60 MG/2ML IM SOLN
60.0000 mg | Freq: Once | INTRAMUSCULAR | Status: AC
  Administered 2014-05-14: 60 mg via INTRAMUSCULAR
  Filled 2014-05-14: qty 2

## 2014-05-14 MED ORDER — CYCLOBENZAPRINE HCL 10 MG PO TABS: 10.0000 mg | ORAL_TABLET | Freq: Two times a day (BID) | ORAL | Status: DC | PRN

## 2014-05-14 MED ORDER — IBUPROFEN 600 MG PO TABS: 600.0000 mg | ORAL_TABLET | Freq: Four times a day (QID) | ORAL | Status: DC | PRN

## 2014-05-14 NOTE — ED Notes (Signed)
I have a sharp pain in my neck and in it's in my right foot also per pt

## 2014-05-14 NOTE — ED Provider Notes (Signed)
CSN: [id number]     Arrival date & time 05/14/14  2240 History  This chart was scribed for Daniel Hacker, MD by Evelene Croon, ED Scribe. This patient was seen in room APA17/APA17 and the patient's care was started 11:12 PM.    Chief Complaint  Patient presents with  . Neck Pain   The history is provided by the patient. No language interpreter was used.    HPI Comments:  Daniel DUVA is a 38 y.o. male who presents to the Emergency Department complaining of sharp, shooting 3/10 pain to his right neck that started about 4 days ago. He denies weakness/numbness/tingling to [name] and BLE. Pt denies fever and HA. Also denies exacerbation of pain with head movement. Pt has applied ice with minimal relief.  Has not taken any medication.  Thinks he may have slept on it wrong.   Past Medical History  Diagnosis Date  . Hypertension   . Hypercholesteremia   . Schizophrenia, paranoid   . Prediabetes   . Sleep apnea    History reviewed. No pertinent past surgical history. No family history on file. History  Substance Use Topics  . Smoking status: Current Every Day Smoker  . Smokeless tobacco: Not on file  . Alcohol Use: Yes    Review of Systems  Constitutional: Negative for fever.  Musculoskeletal: Positive for neck pain. Negative for gait problem and neck stiffness.  Neurological: Negative for weakness, numbness and headaches.  All other systems reviewed and are negative.     Allergies  Review of patient's allergies indicates no known allergies.  Home Medications   Prior to Admission medications   Medication Sig Start Date End Date Taking? Authorizing Provider  ARIPiprazole (ABILIFY) 30 MG tablet Take 30 mg by mouth daily.   Yes Historical Provider, MD  fenofibrate (TRICOR) 145 MG tablet Take 145 mg by mouth daily.   Yes Historical Provider, MD  hydrochlorothiazide (HYDRODIURIL) 25 MG tablet Take 25 mg by mouth daily.   Yes Historical Provider, MD  HYDROcodone-acetaminophen  (NORCO/VICODIN) 5-325 MG per tablet Take one-two tabs po q 4-6 hrs prn pain 03/04/14  Yes Tammy L. Triplett, PA-C  lisinopril (PRINIVIL,ZESTRIL) 40 MG tablet Take 40 mg by mouth daily.   Yes Historical Provider, MD  naproxen (NAPROSYN) 500 MG tablet Take 1 tablet (500 mg total) by mouth 2 (two) times daily. 03/04/14  Yes Tammy L. Triplett, PA-C  cyclobenzaprine (FLEXERIL) 10 MG tablet Take 1 tablet (10 mg total) by mouth 2 (two) times daily as needed for muscle spasms. 05/14/14   Daniel Hacker, MD  ibuprofen (ADVIL,MOTRIN) 600 MG tablet Take 1 tablet (600 mg total) by mouth every 6 (six) hours as needed. 05/14/14   Daniel Hacker, MD   BP 181/83  Pulse 90  Temp(Src) 98.3 F (36.8 C) (Oral)  Resp 18  Ht 6' 1"  (1.854 m)  Wt 308 lb (139.708 kg)  BMI 40.64 kg/m2  SpO2 93% Physical Exam  Nursing note and vitals reviewed. Constitutional: He is oriented to person, place, and time. He appears well-developed and well-nourished.  overweight  HENT:  Head: Normocephalic and atraumatic.  Neck: Normal range of motion. Neck supple.  Tenderness to palpation over the insertion of the rhomboid into the occiput, mild spasm noted  Cardiovascular: Normal rate and regular rhythm.   Pulmonary/Chest: Effort normal. No respiratory distress.  Musculoskeletal: He exhibits no edema.  Lymphadenopathy:    He has no cervical adenopathy.  Neurological: He is alert and oriented to  person, place, and time.  5 out of 5 strength 4 extremities  Skin: Skin is warm and dry.  Psychiatric: He has a normal mood and affect.    ED Course  Procedures    Labs Review Labs Reviewed - No data to display  Imaging Review No results found.   EKG Interpretation None      MDM   Final diagnoses:  Cervical strain, initial encounter    Patient presents with pain the right side of his neck. No known injury. Feels he may have slept wrong. Has not taken anything at home but has applied ice with minimal relief. Current  pain is 3/10. Denies headaches or fevers.  Tenderness palpation of the right rhomboid with spasm. Otherwise full range of motion. Nonfocal. Suspect musculoskeletal strain. Discussed with patient anti-inflammatories the patient will be given a short course of muscle relaxants. Patient stated understanding.  After history, exam, and medical workup I feel the patient has been appropriately medically screened and is safe for discharge home. Pertinent diagnoses were discussed with the patient. Patient was given return precautions.   I personally performed the services described in this documentation, which was scribed in my presence. The recorded information has been reviewed and is accurate.     Daniel Hacker, MD 05/14/14 2328

## 2014-05-14 NOTE — Discharge Instructions (Signed)

## 2015-11-23 ENCOUNTER — Emergency Department (HOSPITAL_COMMUNITY)
Admission: EM | Admit: 2015-11-23 | Discharge: 2015-11-23 | Disposition: A | Discharge status: Home / Self Care | Payer: Medicaid Other | Attending: Emergency Medicine | Admitting: Emergency Medicine

## 2015-11-23 ENCOUNTER — Encounter (HOSPITAL_COMMUNITY): Payer: Self-pay | Admitting: *Deleted

## 2015-11-23 DIAGNOSIS — F17210 Nicotine dependence, cigarettes, uncomplicated: Secondary | ICD-10-CM | POA: Diagnosis not present

## 2015-11-23 DIAGNOSIS — F200 Paranoid schizophrenia: Secondary | ICD-10-CM | POA: Diagnosis not present

## 2015-11-23 DIAGNOSIS — L0291 Cutaneous abscess, unspecified: Secondary | ICD-10-CM

## 2015-11-23 DIAGNOSIS — Z79899 Other long term (current) drug therapy: Secondary | ICD-10-CM | POA: Diagnosis not present

## 2015-11-23 DIAGNOSIS — L02415 Cutaneous abscess of right lower limb: Secondary | ICD-10-CM | POA: Insufficient documentation

## 2015-11-23 DIAGNOSIS — I10 Essential (primary) hypertension: Secondary | ICD-10-CM | POA: Diagnosis not present

## 2015-11-23 MED ORDER — SULFAMETHOXAZOLE-TRIMETHOPRIM 800-160 MG PO TABS: 1.0000 | ORAL_TABLET | Freq: Two times a day (BID) | ORAL | Status: AC

## 2015-11-23 MED ORDER — LIDOCAINE HCL (PF) 1 % IJ SOLN
INTRAMUSCULAR | Status: AC
  Administered 2015-11-23: 14:00:00
  Filled 2015-11-23: qty 5

## 2015-11-23 MED ORDER — HYDROCODONE-ACETAMINOPHEN 5-325 MG PO TABS: ORAL_TABLET | ORAL | Status: DC

## 2015-11-23 NOTE — Discharge Instructions (Signed)
Abscess °An abscess (boil or furuncle) is an infected area on or under the skin. This area is filled with yellowish-white fluid (pus) and other material (debris). °HOME CARE  °· Only take medicines as told by your doctor. °· If you were given antibiotic medicine, take it as directed. Finish the medicine even if you start to feel better. °· If gauze is used, follow your doctor's directions for changing the gauze. °· To avoid spreading the infection: °¨ Keep your abscess covered with a bandage. °¨ Wash your hands well. °¨ Do not share personal care items, towels, or whirlpools with others. °¨ Avoid skin contact with others. °· Keep your skin and clothes clean around the abscess. °· Keep all doctor visits as told. °GET HELP RIGHT AWAY IF:  °· You have more pain, puffiness (swelling), or redness in the wound site. °· You have more fluid or blood coming from the wound site. °· You have muscle aches, chills, or you feel sick. °· You have a fever. °MAKE SURE YOU:  °· Understand these instructions. °· Will watch your condition. °· Will get help right away if you are not doing well or get worse. °  °This information is not intended to replace advice given to you by your health care provider. Make sure you discuss any questions you have with your health care provider. °  °Document Released: 01/27/2008 Document Revised: 02/09/2012 Document Reviewed: 10/24/2011 °Elsevier Interactive Patient Education ©2016 [employer] ° °

## 2015-11-23 NOTE — ED Provider Notes (Signed)
CSN: [id number]     Arrival date & time 11/23/15  1224 History   First MD Initiated Contact with Patient 11/23/15 1300     Chief Complaint  Patient presents with  . Abscess     (Consider location/radiation/quality/duration/timing/severity/associated sxs/prior Treatment) HPI   Daniel Arias is a 40 y.o. male who presents to the Emergency Department complaining of painful abscess to the right upper thigh. States symptoms have been present for 2-3 days. Pain is worse with ambulating. He reports history of previous abscesses but without known MRSA. He complains of generalized body aches and malaise. He denies known fever, vomiting, chills, scrotal pain or swelling.   Past Medical History  Diagnosis Date  . Hypertension   . Hypercholesteremia   . Schizophrenia, paranoid ([place])   . Prediabetes   . Sleep apnea    History reviewed. No pertinent past surgical history. No family history on file. Social History  Substance Use Topics  . Smoking status: Current Every Day Smoker -- 0.25 packs/day    Types: Cigarettes  . Smokeless tobacco: None  . Alcohol Use: Yes     Comment: occ.     Review of Systems  Constitutional: Negative for fever and chills.  Gastrointestinal: Negative for nausea and vomiting.  Musculoskeletal: Negative for joint swelling and arthralgias.  Skin: Positive for color change.       Abscess   Hematological: Negative for adenopathy.  All other systems reviewed and are negative.     Allergies  Review of patient's allergies indicates no known allergies.  Home Medications   Prior to Admission medications   Medication Sig Start Date End Date Taking? Authorizing Provider  ARIPiprazole (ABILIFY) 30 MG tablet Take 30 mg by mouth daily.    Historical Provider, MD  cyclobenzaprine (FLEXERIL) 10 MG tablet Take 1 tablet (10 mg total) by mouth 2 (two) times daily as needed for muscle spasms. 05/14/14   Merryl Hacker, MD  fenofibrate (TRICOR) 145 MG tablet Take 145 mg  by mouth daily.    Historical Provider, MD  hydrochlorothiazide (HYDRODIURIL) 25 MG tablet Take 25 mg by mouth daily.    Historical Provider, MD  HYDROcodone-acetaminophen (NORCO/VICODIN) 5-325 MG per tablet Take one-two tabs po q 4-6 hrs prn pain 03/04/14   Cain Fitzhenry, PA-C  ibuprofen (ADVIL,MOTRIN) 600 MG tablet Take 1 tablet (600 mg total) by mouth every 6 (six) hours as needed. 05/14/14   Merryl Hacker, MD  lisinopril (PRINIVIL,ZESTRIL) 40 MG tablet Take 40 mg by mouth daily.    Historical Provider, MD  naproxen (NAPROSYN) 500 MG tablet Take 1 tablet (500 mg total) by mouth 2 (two) times daily. 03/04/14   Danaysha Kirn, PA-C   BP 151/85 mmHg  Pulse 101  Temp(Src) 98.6 F (37 C) (Oral)  Resp 18  Ht 6' 1"  (1.854 m)  Wt 133.358 kg  BMI 38.80 kg/m2  SpO2 98% Physical Exam  Constitutional: He is oriented to person, place, and time. He appears well-developed and well-nourished. No distress.  HENT:  Head: Normocephalic and atraumatic.  Cardiovascular: Normal rate, regular rhythm and normal heart sounds.   No murmur heard. Pulmonary/Chest: Effort normal and breath sounds normal. No respiratory distress.  Abdominal: Soft. He exhibits no distension. There is no tenderness. There is no rebound.  Musculoskeletal: Normal range of motion.  Neurological: He is alert and oriented to person, place, and time. He exhibits normal muscle tone. Coordination normal.  Skin: Skin is warm and dry. There is erythema.  Fluctuant abscess  to the upper right inner thigh mild to moderate induration. No significant erythema, no drainage.  Nursing note and vitals reviewed.   ED Course  Procedures (including critical care time) Labs Review Labs Reviewed - No data to display  Imaging Review No results found. I have personally reviewed and evaluated these images and lab results as part of my medical decision-making.   EKG Interpretation None     INCISION AND DRAINAGE Performed by: Hale Bogus. Consent: Verbal consent obtained. Risks and benefits: risks, benefits and alternatives were discussed Type: abscess  Body area: right inner thigh  Anesthesia: local infiltration  Incision was made with a #11 scalpel.  Local anesthetic: lidocaine 1% w/o epinephrine  Anesthetic total: 3 ml  Complexity: complex Blunt dissection to break up loculations  Drainage: purulent  Drainage amount: Copious   Packing material: 1/4 in iodoform gauze  Patient tolerance: Patient tolerated the procedure well with no immediate complications.    MDM   Final diagnoses:  Abscess    Patient well appearing. Nontoxic. Abscess to the right inner thigh with history of same. No significant cellulitis. Patient agrees to warm soaks packing removal in 2 days and to return here if needed.    Kem Parkinson, PA-C 11/23/15 1403  Ezequiel Essex, MD 11/23/15 [phone]

## 2015-11-23 NOTE — ED Notes (Signed)
Pt has an abscess on his right upper thigh. Area is not draining and no redness noted. VS stable in triage.

## 2016-06-24 ENCOUNTER — Other Ambulatory Visit: Payer: Self-pay | Admitting: Family Medicine

## 2016-06-24 ENCOUNTER — Ambulatory Visit (INDEPENDENT_AMBULATORY_CARE_PROVIDER_SITE_OTHER): Payer: Medicaid Other | Admitting: Family Medicine

## 2016-06-24 ENCOUNTER — Encounter: Payer: Self-pay | Admitting: Family Medicine

## 2016-06-24 VITALS — BP 138/80 | HR 88 | Temp 99.3°F | Resp 20 | Ht 73.75 in | Wt 308.1 lb

## 2016-06-24 DIAGNOSIS — Z7689 Persons encountering health services in other specified circumstances: Secondary | ICD-10-CM | POA: Diagnosis not present

## 2016-06-24 DIAGNOSIS — R911 Solitary pulmonary nodule: Secondary | ICD-10-CM | POA: Insufficient documentation

## 2016-06-24 DIAGNOSIS — E669 Obesity, unspecified: Secondary | ICD-10-CM | POA: Insufficient documentation

## 2016-06-24 DIAGNOSIS — E6609 Other obesity due to excess calories: Secondary | ICD-10-CM

## 2016-06-24 DIAGNOSIS — E785 Hyperlipidemia, unspecified: Secondary | ICD-10-CM | POA: Insufficient documentation

## 2016-06-24 DIAGNOSIS — Z23 Encounter for immunization: Secondary | ICD-10-CM

## 2016-06-24 DIAGNOSIS — R7303 Prediabetes: Secondary | ICD-10-CM

## 2016-06-24 DIAGNOSIS — F200 Paranoid schizophrenia: Secondary | ICD-10-CM

## 2016-06-24 DIAGNOSIS — IMO0001 Reserved for inherently not codable concepts without codable children: Secondary | ICD-10-CM

## 2016-06-24 DIAGNOSIS — I10 Essential (primary) hypertension: Secondary | ICD-10-CM | POA: Insufficient documentation

## 2016-06-24 DIAGNOSIS — Z720 Tobacco use: Secondary | ICD-10-CM | POA: Insufficient documentation

## 2016-06-24 DIAGNOSIS — Z6839 Body mass index (BMI) 39.0-39.9, adult: Secondary | ICD-10-CM

## 2016-06-24 DIAGNOSIS — G4730 Sleep apnea, unspecified: Secondary | ICD-10-CM | POA: Insufficient documentation

## 2016-06-24 HISTORY — DX: Sleep apnea, unspecified: G47.30

## 2016-06-24 MED ORDER — LOSARTAN POTASSIUM 50 MG PO TABS: 50.0000 mg | ORAL_TABLET | Freq: Every day | ORAL | 1 refills | Status: DC

## 2016-06-24 MED ORDER — HYDROCHLOROTHIAZIDE 25 MG PO TABS: 25.0000 mg | ORAL_TABLET | Freq: Every day | ORAL | 1 refills | Status: DC

## 2016-06-24 NOTE — Patient Instructions (Signed)
Walk every day that you are able Watch the carbohydrates and sweets in your diet Try to lose a little weight each visit  Medicines are refilled Referred for diabetic eye exam  Need old records  Need blood work prior to your next visit See me every 3 months Call sooner for problems

## 2016-06-24 NOTE — Progress Notes (Addendum)
Chief Complaint  Patient presents with  . Establish Care   New to establish Brought in by mother Fair historian, poor fund of knowledge No old records available Patient has paranoid schizophrenia is under the care of a mental health professional in [place]. He is on Abilify 30 mg daily. He states that this is working well for him. He is disabled from his mental illness. He is not employed. He lives with a girlfriend. He was recently diagnosed as having diabetes. He did go to a dietitian. He thinks he is doing okay with his diet, but this is uncertain. He is gaining weight. He reports sugars anywhere from 104-212, randomly. He checks his sugar every couple of days. He hasn't had any high or low sugars. He is taking metformin thousand milligrams twice a day. He is compliant with his medication. He has not had any diabetic complications. He has never had an eye exam. Jawaan states he has high blood pressure. He is compliant with his Cozaar and hydrochlorothiazide. Takes 1 pill a day each. Blood pressure is well controlled. He also has hyperlipidemia. He is not currently taking any medicine for the hyperlipidemia. Previously was on fenofibrate, also fish oil. He states these make him sleepy so he stopped. He is uncertain whether he's ever been on a statin Patient has obstructive sleep apnea. He is compliant with his CPAP. He feels better when he uses it. He wears it every night. Uses O2 supplementation with his CPAP.  He is doing well with no side effects. I have discussed the multiple health risks associated with cigarette smoking including, but not limited to, cardiovascular disease, lung disease and cancer.  I have strongly recommended that smoking be stopped.  I have reviewed the various methods of quitting including cold [place], classes, nicotine replacements and prescription medications.  I have offered assistance in this difficult process.  The patient is not interested in assistance at this  time.    Patient Active Problem List   Diagnosis Date Noted  . Essential hypertension 06/24/2016  . HLD (hyperlipidemia) 06/24/2016  . Prediabetes 06/24/2016  . Paranoid schizophrenia ([place]) 06/24/2016  . Sleep apnea 06/24/2016  . Tobacco abuse 06/24/2016  . Lung nodule seen on imaging study 06/24/2016  . Obesity 06/24/2016    Outpatient Encounter Prescriptions as of 06/24/2016  Medication Sig  . ARIPiprazole (ABILIFY) 30 MG tablet Take 30 mg by mouth daily.  . hydrochlorothiazide (HYDRODIURIL) 25 MG tablet Take 1 tablet (25 mg total) by mouth daily.  Marland Kitchen losartan (COZAAR) 50 MG tablet Take 1 tablet (50 mg total) by mouth daily.  . metFORMIN (GLUCOPHAGE-XR) 500 MG 24 hr tablet TAKE 2 TABLETS (1,000 MG) BY MOUTH 2 TIMES PER DAY WITH MEALS   No facility-administered encounter medications on file as of 06/24/2016.    Past Medical History:  Diagnosis Date  . Diabetes mellitus without complication ([place])   . GERD (gastroesophageal reflux disease)   . Hypercholesteremia   . Hypertension   . Prediabetes   . Schizophrenia, paranoid ([place])   . Sleep apnea   . Sleep apnea 06/24/2016  . Substance abuse    No past surgical history on file.  Social History   Social History  . Marital status: Single    Spouse name: N/A  . Number of children: N/A  . Years of education: N/A   Occupational History  . Not on file.   Social History Main Topics  . Smoking status: Current Every Day Smoker  Packs/day: 0.25    Types: Cigarettes  . Smokeless tobacco: Never Used  . Alcohol use No     Comment: maybe once a month  . Drug use:     Types: Marijuana  . Sexual activity: Yes    Birth control/ protection: Surgical   Other Topics Concern  . Not on file   Social History Narrative  . No narrative on file    Family History  Problem Relation Age of Onset  . Diabetes Mother   . Mental illness Mother     Review of Systems  Constitutional: Negative for chills, fever and weight loss.        Weight gain  HENT: Negative for congestion and hearing loss.        Needs dentist  Eyes: Negative for blurred vision and pain.       Vision slowly declining  Respiratory: Negative for cough and shortness of breath.        Denies cough or shortness of breath from tobacco abuse  Cardiovascular: Negative for chest pain and leg swelling.  Gastrointestinal: Positive for heartburn. Negative for abdominal pain, constipation and diarrhea.       Occasional heartburn, does not need medication  Genitourinary: Negative for dysuria and frequency.  Musculoskeletal: Negative for falls, joint pain and myalgias.  Neurological: Negative for dizziness, seizures and headaches.  Psychiatric/Behavioral: Negative for depression. The patient is not nervous/anxious and does not have insomnia.        Patient states he feels stable on his current medical regimen.    BP 138/80 (BP Location: Left Arm, Patient Position: Sitting, Cuff Size: Large)   Pulse 88   Temp 99.3 F (37.4 C) (Oral)   Resp 20   Ht 6' 1.75" (1.873 m)   Wt (!) 308 lb 1.9 oz (139.8 kg)   SpO2 95%   BMI 39.83 kg/m   Physical Exam  Constitutional: He appears well-developed and well-nourished.  Morbid obesity. Mildly impaired hygiene  HENT:  Head: Normocephalic and atraumatic.  Right Ear: External ear normal.  Left Ear: External ear normal.  Mouth/Throat: Oropharynx is clear and moist.  Partial cerumen both ears. Dental disease  Eyes: Conjunctivae are normal. Pupils are equal, round, and reactive to light.  Neck: Normal range of motion. Neck supple. No thyromegaly present.  Cardiovascular: Normal rate, regular rhythm and normal heart sounds.   Pulmonary/Chest: Effort normal and breath sounds normal. No respiratory distress.  Abdominal: Soft. Bowel sounds are normal.  Musculoskeletal: Normal range of motion. He exhibits no edema.  Lymphadenopathy:    He has no cervical adenopathy.  Neurological: He is alert.  Gait normal  Skin: Skin  is warm and dry.  Psychiatric: He has a normal mood and affect. His behavior is normal.  Poor fund of knowledge. Some slow responses. Polite and cooperative.  Nursing note and vitals reviewed.   ASSESSMENT/PLAN:   1. Essential hypertension Controlled. Continue current medication  2. Hyperlipidemia, unspecified hyperlipidemia type unCertain control. Will check lipid panel  3. Prediabetes uncertain control. We'll check A1c - CBC - Comprehensive metabolic panel - Hemoglobin A1c - Lipid panel - Urinalysis, Routine w reflex microscopic (not at [facility]) - VITAMIN D 25 Hydroxy (Vit-D Deficiency, Fractures) - Ambulatory referral to Ophthalmology  4. Paranoid schizophrenia ([place]) Stable under care of psychiatry  5. Sleep apnea, unspecified type Compliant with CPAP/oxygen supplement  6. Tobacco abuse Not interested in quitting  7. Class 2 obesity due to excess calories with serious comorbidity and body mass index (  BMI) of 39.0 to 39.9 in adult Discussed diet and exercise  8. Encounter to establish care with new doctor Review old records   Patient Instructions  Walk every day that you are able Watch the carbohydrates and sweets in your diet Try to lose a little weight each visit  Medicines are refilled Referred for diabetic eye exam  Need old records  Need blood work prior to your next visit See me every 3 months Call sooner for problems    Raylene Everts, MD 60 minutes is spent with this patient and reviewing his records and discussing his medications, past medical history, medical needs. I also spent some time in counseling him on diet, exercise, and smoking cessation. Importance of compliance with diet exercise and lifestyle changes is reviewed.

## 2016-07-06 ENCOUNTER — Encounter: Payer: Self-pay | Admitting: Family Medicine

## 2016-07-06 DIAGNOSIS — E119 Type 2 diabetes mellitus without complications: Secondary | ICD-10-CM | POA: Insufficient documentation

## 2016-07-31 ENCOUNTER — Telehealth: Payer: Self-pay | Admitting: Family Medicine

## 2016-07-31 NOTE — Telephone Encounter (Signed)
Sharon is calling stating that his pulmonologist in [place] has prescribed him a Teaching laboratory technician for [employer] for everyday use, his [place] medicaid wont pay for it since the Dr. Prescribing is in [place] . He is asking if Dr. Meda Coffee will take over prescribing his inhaler so insurance will pay.

## 2016-07-31 NOTE — Telephone Encounter (Signed)
Note.. This is not on his med list

## 2016-08-03 MED ORDER — ALBUTEROL SULFATE HFA 108 (90 BASE) MCG/ACT IN AERS: 2.0000 | INHALATION_SPRAY | Freq: Four times a day (QID) | RESPIRATORY_TRACT | 0 refills | Status: DC | PRN

## 2016-08-03 NOTE — Telephone Encounter (Signed)
Pend one inhaler for albuterol, and see if we can get the records from pulmonary

## 2016-08-04 NOTE — Telephone Encounter (Signed)
Patient is aware that Dr. Meda Coffee is Requesting Pulmonologist Records

## 2016-09-24 ENCOUNTER — Ambulatory Visit (INDEPENDENT_AMBULATORY_CARE_PROVIDER_SITE_OTHER): Payer: Medicaid Other | Admitting: Family Medicine

## 2016-09-24 ENCOUNTER — Encounter: Payer: Self-pay | Admitting: Family Medicine

## 2016-09-24 VITALS — BP 134/82 | HR 92 | Temp 98.8°F | Resp 20 | Ht 73.75 in | Wt 303.1 lb

## 2016-09-24 DIAGNOSIS — R911 Solitary pulmonary nodule: Secondary | ICD-10-CM | POA: Diagnosis not present

## 2016-09-24 DIAGNOSIS — E119 Type 2 diabetes mellitus without complications: Secondary | ICD-10-CM

## 2016-09-24 DIAGNOSIS — K7581 Nonalcoholic steatohepatitis (NASH): Secondary | ICD-10-CM

## 2016-09-24 DIAGNOSIS — I10 Essential (primary) hypertension: Secondary | ICD-10-CM | POA: Diagnosis not present

## 2016-09-24 DIAGNOSIS — Z720 Tobacco use: Secondary | ICD-10-CM

## 2016-09-24 NOTE — Patient Instructions (Addendum)
Go get your blood tests I will send you a letter with your test results.  If there is anything of concern, we will call right away.  Continue to eat well Try to walk every day that you are able  Continue to try to quit smoking  Need eye exam  Labs and visit in 3 months

## 2016-09-24 NOTE — Progress Notes (Signed)
Chief Complaint  Patient presents with  . Follow-up    3 month   He had a follow up CT scan of his chest today and his lung nodule is deemed benign. He continues to smoke but is planning to attend a smoke cessation class to try to quit He is not taking his medicine as prescribed, the metformin 500 BID.  He says 1000 BID made him "dizzy" Is overdue for labs and agrees to get today He has lost 5 lbs an dis encouraged. BP control is good Continues compliant with psychiatry medicine and counseling. Discussed importance of daily exercise  Patient Active Problem List   Diagnosis Date Noted  . Nonalcoholic steatohepatitis ([facility]) 09/24/2016  . Type 2 diabetes mellitus ([place]) 07/06/2016  . Essential hypertension 06/24/2016  . HLD (hyperlipidemia) 06/24/2016  . Paranoid schizophrenia ([place]) 06/24/2016  . Sleep apnea 06/24/2016  . Tobacco abuse 06/24/2016  . Lung nodule seen on imaging study- benign 06/24/2016  . Obesity 06/24/2016    Outpatient Encounter Prescriptions as of 09/24/2016  Medication Sig  . albuterol (PROVENTIL HFA;VENTOLIN HFA) 108 (90 Base) MCG/ACT inhaler Inhale 2 puffs into the lungs every 6 (six) hours as needed for wheezing or shortness of breath.  . ARIPiprazole (ABILIFY) 30 MG tablet Take 30 mg by mouth daily.  . hydrochlorothiazide (HYDRODIURIL) 25 MG tablet Take 1 tablet (25 mg total) by mouth daily.  Marland Kitchen losartan (COZAAR) 50 MG tablet Take 1 tablet (50 mg total) by mouth daily.  . metFORMIN (GLUCOPHAGE-XR) 500 MG 24 hr tablet 500 mg twice daily   No facility-administered encounter medications on file as of 09/24/2016.     No Known Allergies  Review of Systems  Constitutional: Positive for unexpected weight change. Negative for activity change and appetite change.  HENT: Negative for congestion and dental problem.   Eyes: Negative for photophobia and visual disturbance.       Eye exam 10 y ago  Respiratory: Negative for cough and shortness of breath.     Cardiovascular: Negative for chest pain, palpitations and leg swelling.  Gastrointestinal: Negative for constipation and diarrhea.  Genitourinary: Negative for difficulty urinating and frequency.  Musculoskeletal: Negative for arthralgias and back pain.  Neurological: Negative for dizziness and headaches.  Psychiatric/Behavioral: Negative for dysphoric mood and sleep disturbance. The patient is not nervous/anxious.        Sleeps well with CPAP    BP 134/82 (BP Location: Right Arm, Patient Position: Sitting, Cuff Size: Large)   Pulse 92   Temp 98.8 F (37.1 C) (Oral)   Resp 20   Ht 6' 1.75" (1.873 m)   Wt (!) 303 lb 1.9 oz (137.5 kg)   SpO2 96%   BMI 39.18 kg/m   Physical Exam  Constitutional: He appears well-developed and well-nourished.  overweight  HENT:  Head: Normocephalic and atraumatic.  Mouth/Throat: Oropharynx is clear and moist.  Dental disease  Eyes: Conjunctivae are normal. Pupils are equal, round, and reactive to light.  Neck: Normal range of motion. Neck supple. No thyromegaly present.  Cardiovascular: Normal rate, regular rhythm and normal heart sounds.   Pulmonary/Chest: Effort normal and breath sounds normal. No respiratory distress.  Abdominal: Soft. Bowel sounds are normal.  Musculoskeletal: Normal range of motion. He exhibits no edema.  Lymphadenopathy:    He has no cervical adenopathy.  Neurological: He is alert.  Gait normal  Skin: Skin is warm and dry.  Psychiatric: He has a normal mood and affect. His behavior is normal.  Poor  fund of knowledge. Some slow responses. Polite and cooperative.  Nursing note and vitals reviewed.   ASSESSMENT/PLAN:  1. Type 2 diabetes mellitus without complication, without long-term current use of insulin (HCC)  - Microalbumin / creatinine urine ratio - Ambulatory referral to Ophthalmology  2. Lung nodule seen on imaging study   3. Nonalcoholic steatohepatitis ([facility])   4. Essential hypertension   5.  Tobacco abuse    Patient Instructions  Go get your blood tests I will send you a letter with your test results.  If there is anything of concern, we will call right away.  Continue to eat well Try to walk every day that you are able  Continue to try to quit smoking  Need eye exam  Labs and visit in 3 months   Raylene Everts, MD

## 2016-09-25 LAB — MICROALBUMIN / CREATININE URINE RATIO
Creatinine, Urine: 316 mg/dL (ref 20–370)
Microalb Creat Ratio: 93 mcg/mg creat — ABNORMAL HIGH (ref <30)
Microalb, Ur: 29.5 mg/dL

## 2016-09-26 LAB — LIPID PANEL
CHOLESTEROL: 160 mg/dL (ref <200)
HDL: 25 mg/dL — AB (ref >40)
Total CHOL/HDL Ratio: 6.4 Ratio — ABNORMAL HIGH (ref <5.0)
Triglycerides: 911 mg/dL — ABNORMAL HIGH (ref <150)

## 2016-09-26 LAB — COMPREHENSIVE METABOLIC PANEL
ALT: 17 U/L (ref 9–46)
AST: 15 U/L (ref 10–40)
Albumin: 4.5 g/dL (ref 3.6–5.1)
Alkaline Phosphatase: 62 U/L (ref 40–115)
BUN: 9 mg/dL (ref 7–25)
CALCIUM: 9.8 mg/dL (ref 8.6–10.3)
CHLORIDE: 101 mmol/L (ref 98–110)
CO2: 26 mmol/L (ref 20–31)
Creat: 0.84 mg/dL (ref 0.60–1.35)
GLUCOSE: 145 mg/dL — AB (ref 65–99)
Potassium: 3.9 mmol/L (ref 3.5–5.3)
Sodium: 140 mmol/L (ref 135–146)
Total Bilirubin: 0.3 mg/dL (ref 0.2–1.2)
Total Protein: 7.6 g/dL (ref 6.1–8.1)

## 2016-09-26 LAB — CBC
HCT: 44.8 % (ref 38.5–50.0)
Hemoglobin: 15 g/dL (ref 13.2–17.1)
MCH: 27.3 pg (ref 27.0–33.0)
MCHC: 33.5 g/dL (ref 32.0–36.0)
MCV: 81.5 fL (ref 80.0–100.0)
MPV: 10 fL (ref 7.5–12.5)
PLATELETS: 366 10*3/uL (ref 140–400)
RBC: 5.5 MIL/uL (ref 4.20–5.80)
RDW: 14.9 % (ref 11.0–15.0)
WBC: 12.9 10*3/uL — ABNORMAL HIGH (ref 3.8–10.8)

## 2016-09-28 LAB — VITAMIN D 25 HYDROXY (VIT D DEFICIENCY, FRACTURES): Vit D, 25-Hydroxy: 12 ng/mL — ABNORMAL LOW (ref 30–100)

## 2016-09-28 LAB — HEMOGLOBIN A1C
Hgb A1c MFr Bld: 6.6 % — ABNORMAL HIGH (ref <5.7)
Mean Plasma Glucose: 143 mg/dL

## 2016-09-29 ENCOUNTER — Other Ambulatory Visit: Payer: Self-pay | Admitting: Family Medicine

## 2016-09-29 ENCOUNTER — Encounter: Payer: Self-pay | Admitting: Family Medicine

## 2016-09-29 DIAGNOSIS — E782 Mixed hyperlipidemia: Secondary | ICD-10-CM

## 2016-09-29 DIAGNOSIS — E559 Vitamin D deficiency, unspecified: Secondary | ICD-10-CM

## 2016-09-29 MED ORDER — ATORVASTATIN CALCIUM 40 MG PO TABS: 40.0000 mg | ORAL_TABLET | Freq: Every day | ORAL | 11 refills | Status: DC

## 2016-09-29 MED ORDER — VITAMIN D (ERGOCALCIFEROL) 1.25 MG (50000 UNIT) PO CAPS: 50000.0000 [IU] | ORAL_CAPSULE | ORAL | 1 refills | Status: DC

## 2016-09-30 LAB — URINALYSIS, ROUTINE W REFLEX MICROSCOPIC
BILIRUBIN URINE: NEGATIVE
GLUCOSE, UA: NEGATIVE
Hgb urine dipstick: NEGATIVE
Ketones, ur: NEGATIVE
LEUKOCYTES UA: NEGATIVE
Nitrite: NEGATIVE
PH: 5 (ref 5.0–8.0)
PROTEIN: NEGATIVE
Specific Gravity, Urine: 1.018 (ref 1.001–1.035)

## 2016-10-05 ENCOUNTER — Telehealth: Payer: Self-pay

## 2016-10-06 NOTE — Telephone Encounter (Signed)
Spoke to pt to confirm him needing to take lipitor.

## 2016-10-15 ENCOUNTER — Other Ambulatory Visit: Payer: Self-pay | Admitting: Family Medicine

## 2016-12-21 ENCOUNTER — Other Ambulatory Visit: Payer: Self-pay | Admitting: Family Medicine

## 2016-12-21 NOTE — Telephone Encounter (Signed)
Seen 2 1 18

## 2016-12-22 ENCOUNTER — Encounter: Payer: Self-pay | Admitting: Family Medicine

## 2016-12-22 ENCOUNTER — Ambulatory Visit (INDEPENDENT_AMBULATORY_CARE_PROVIDER_SITE_OTHER): Payer: Medicaid Other | Admitting: Family Medicine

## 2016-12-22 VITALS — BP 134/80 | HR 84 | Temp 97.4°F | Resp 20 | Ht 74.0 in | Wt 294.0 lb

## 2016-12-22 DIAGNOSIS — E559 Vitamin D deficiency, unspecified: Secondary | ICD-10-CM

## 2016-12-22 DIAGNOSIS — E785 Hyperlipidemia, unspecified: Secondary | ICD-10-CM | POA: Diagnosis not present

## 2016-12-22 DIAGNOSIS — I10 Essential (primary) hypertension: Secondary | ICD-10-CM | POA: Diagnosis not present

## 2016-12-22 DIAGNOSIS — E119 Type 2 diabetes mellitus without complications: Secondary | ICD-10-CM | POA: Diagnosis not present

## 2016-12-22 DIAGNOSIS — Z720 Tobacco use: Secondary | ICD-10-CM

## 2016-12-22 NOTE — Progress Notes (Signed)
Chief Complaint  Patient presents with  . Follow-up    4 month   Patient has lost weight. His blood pressure is good. He is congratulated on this effort. He has reduced his cigarette smoking. His diabetes is well controlled. His last A1c was 6.6. His lipids at last visit were abnormal. His triglycerides are very high at over 900. He was started on a statin. He is taking it daily. We will repeat his lipids today He is compliant with his counseling and schizophrenic medications. He states that he is stable. History increase activity. He is walking more. I reviewed with him the health benefits of regular exercise including diabetes management, weight, hypertension as well as mood and sleep improvement. He was identified as being vitamin D deficient. He took a supplement. We will repeat today.  Patient Active Problem List   Diagnosis Date Noted  . Vitamin D deficiency 09/29/2016  . Nonalcoholic steatohepatitis ([facility]) 09/24/2016  . Type 2 diabetes mellitus ([place]) 07/06/2016  . Essential hypertension 06/24/2016  . HLD (hyperlipidemia) 06/24/2016  . Paranoid schizophrenia ([place]) 06/24/2016  . Sleep apnea 06/24/2016  . Tobacco abuse 06/24/2016  . Lung nodule seen on imaging study- benign 06/24/2016  . Obesity 06/24/2016    Outpatient Encounter Prescriptions as of 12/22/2016  Medication Sig  . ACCU-CHEK AVIVA PLUS test strip USE TO TEST BLOOD SUGAR ONCE DAILY  . albuterol (PROVENTIL HFA;VENTOLIN HFA) 108 (90 Base) MCG/ACT inhaler Inhale 2 puffs into the lungs every 6 (six) hours as needed for wheezing or shortness of breath.  . ARIPiprazole (ABILIFY) 30 MG tablet Take 30 mg by mouth daily.  Marland Kitchen atorvastatin (LIPITOR) 40 MG tablet Take 1 tablet (40 mg total) by mouth daily.  . hydrochlorothiazide (HYDRODIURIL) 25 MG tablet TAKE 1 TABLET (25 MG TOTAL) BY MOUTH DAILY.  Marland Kitchen losartan (COZAAR) 50 MG tablet Take 1 tablet (50 mg total) by mouth daily.  . metFORMIN (GLUCOPHAGE-XR) 500 MG 24 hr tablet  500 mg twice daily  . Vitamin D, Ergocalciferol, (DRISDOL) 50000 units CAPS capsule Take 1 capsule (50,000 Units total) by mouth every 7 (seven) days.   No facility-administered encounter medications on file as of 12/22/2016.     No Known Allergies  Review of Systems  Constitutional: Positive for unexpected weight change. Negative for activity change and appetite change.       Loss  HENT: Negative for congestion and dental problem.   Eyes: Negative for photophobia and visual disturbance.  Respiratory: Negative for cough and shortness of breath.   Cardiovascular: Negative for chest pain, palpitations and leg swelling.  Gastrointestinal: Negative for constipation and diarrhea.  Genitourinary: Negative for difficulty urinating and frequency.  Musculoskeletal: Negative for arthralgias and back pain.  Neurological: Negative for dizziness and headaches.  Psychiatric/Behavioral: Negative for dysphoric mood and sleep disturbance. The patient is not nervous/anxious.        Sleeps well with CPAP   BP 134/80 (BP Location: Right Arm, Patient Position: Sitting, Cuff Size: Large)   Pulse 84   Temp 97.4 F (36.3 C) (Temporal)   Resp 20   Ht 6' 2"  (1.88 m)   Wt 294 lb (133.4 kg)   SpO2 95%   BMI 37.75 kg/m   Physical Exam  Constitutional: He appears well-developed and well-nourished.  overweight  HENT:  Head: Normocephalic and atraumatic.  Mouth/Throat: Oropharynx is clear and moist.  Dental disease  Eyes: Conjunctivae are normal. Pupils are equal, round, and reactive to light.  Neck: Normal range of motion.  Neck supple. No thyromegaly present.  Cardiovascular: Normal rate, regular rhythm and normal heart sounds.   Pulmonary/Chest: Effort normal and breath sounds normal. No respiratory distress.  Abdominal: Soft. Bowel sounds are normal.  Musculoskeletal: Normal range of motion. He exhibits no edema.  Lymphadenopathy:    He has no cervical adenopathy.  Neurological: He is alert.  Gait  normal  Skin: Skin is warm and dry.  Psychiatric: He has a normal mood and affect. His behavior is normal.  Poor fund of knowledge. Some slow responses. Polite and cooperative.  Nursing note and vitals reviewed.   ASSESSMENT/PLAN:  1. Vitamin D deficiency  - VITAMIN D 25 Hydroxy (Vit-D Deficiency, Fractures)  2. Tobacco abuse Improved  3. Hyperlipidemia, unspecified hyperlipidemia type Compliant with statin. Repeat labs today - Lipid panel  4. Type 2 diabetes mellitus without complication, without long-term current use of insulin (HCC)  - Hemoglobin I3B - BASIC METABOLIC PANEL WITH GFR  5. Essential hypertension Well controlled   Patient Instructions  You have lost weight- good for you Your Blood Pressure is good need blood work today Continue current medicines See me in 3 months Call sooner for any problems I am glad you are continuing to cut down on the smoking   Raylene Everts, MD

## 2016-12-22 NOTE — Patient Instructions (Signed)
You have lost weight- good for you Your Blood Pressure is good need blood work today Continue current medicines See me in 3 months Call sooner for any problems I am glad you are continuing to cut down on the smoking

## 2016-12-23 ENCOUNTER — Encounter: Payer: Self-pay | Admitting: Family Medicine

## 2016-12-23 LAB — LIPID PANEL
CHOL/HDL RATIO: 3.3 ratio (ref <5.0)
Cholesterol: 79 mg/dL (ref <200)
HDL: 24 mg/dL — AB (ref >40)
Triglycerides: 421 mg/dL — ABNORMAL HIGH (ref <150)

## 2016-12-23 LAB — BASIC METABOLIC PANEL WITH GFR
BUN: 9 mg/dL (ref 7–25)
CHLORIDE: 102 mmol/L (ref 98–110)
CO2: 25 mmol/L (ref 20–31)
CREATININE: 0.84 mg/dL (ref 0.60–1.35)
Calcium: 10 mg/dL (ref 8.6–10.3)
GFR, Est African American: >89 mL/min (ref >=60)
GFR, Est Non African American: >89 mL/min (ref >=60)
Glucose, Bld: 139 mg/dL — ABNORMAL HIGH (ref 65–99)
Potassium: 4 mmol/L (ref 3.5–5.3)
Sodium: 141 mmol/L (ref 135–146)

## 2016-12-23 LAB — HEMOGLOBIN A1C
Hgb A1c MFr Bld: 7.8 % — ABNORMAL HIGH (ref <5.7)
Mean Plasma Glucose: 177 mg/dL

## 2016-12-23 LAB — VITAMIN D 25 HYDROXY (VIT D DEFICIENCY, FRACTURES): VIT D 25 HYDROXY: 28 ng/mL — AB (ref 30–100)

## 2017-01-21 ENCOUNTER — Other Ambulatory Visit: Payer: Self-pay | Admitting: Family Medicine

## 2017-02-03 ENCOUNTER — Emergency Department (HOSPITAL_COMMUNITY)
Admission: EM | Admit: 2017-02-03 | Discharge: 2017-02-03 | Disposition: A | Discharge status: Home / Self Care | Payer: Medicaid Other | Attending: Emergency Medicine | Admitting: Emergency Medicine

## 2017-02-03 ENCOUNTER — Encounter (HOSPITAL_COMMUNITY): Payer: Self-pay | Admitting: *Deleted

## 2017-02-03 DIAGNOSIS — S93492A Sprain of other ligament of left ankle, initial encounter: Secondary | ICD-10-CM | POA: Diagnosis not present

## 2017-02-03 DIAGNOSIS — Z7984 Long term (current) use of oral hypoglycemic drugs: Secondary | ICD-10-CM | POA: Diagnosis not present

## 2017-02-03 DIAGNOSIS — E119 Type 2 diabetes mellitus without complications: Secondary | ICD-10-CM | POA: Insufficient documentation

## 2017-02-03 DIAGNOSIS — F17210 Nicotine dependence, cigarettes, uncomplicated: Secondary | ICD-10-CM | POA: Insufficient documentation

## 2017-02-03 DIAGNOSIS — S99922A Unspecified injury of left foot, initial encounter: Secondary | ICD-10-CM | POA: Diagnosis present

## 2017-02-03 DIAGNOSIS — Y998 Other external cause status: Secondary | ICD-10-CM | POA: Diagnosis not present

## 2017-02-03 DIAGNOSIS — W1849XA Other slipping, tripping and stumbling without falling, initial encounter: Secondary | ICD-10-CM | POA: Insufficient documentation

## 2017-02-03 DIAGNOSIS — I10 Essential (primary) hypertension: Secondary | ICD-10-CM | POA: Diagnosis not present

## 2017-02-03 DIAGNOSIS — Y92007 Garden or yard of unspecified non-institutional (private) residence as the place of occurrence of the external cause: Secondary | ICD-10-CM | POA: Insufficient documentation

## 2017-02-03 DIAGNOSIS — Z79899 Other long term (current) drug therapy: Secondary | ICD-10-CM | POA: Insufficient documentation

## 2017-02-03 DIAGNOSIS — Y93H2 Activity, gardening and landscaping: Secondary | ICD-10-CM | POA: Diagnosis not present

## 2017-02-03 DIAGNOSIS — S93499S Sprain of other ligament of unspecified ankle, sequela: Secondary | ICD-10-CM

## 2017-02-03 MED ORDER — IBUPROFEN 800 MG PO TABS
800.0000 mg | ORAL_TABLET | Freq: Once | ORAL | Status: AC
  Administered 2017-02-03: 800 mg via ORAL
  Filled 2017-02-03: qty 1

## 2017-02-03 MED ORDER — IBUPROFEN 600 MG PO TABS: 600.0000 mg | ORAL_TABLET | Freq: Four times a day (QID) | ORAL | 0 refills | Status: DC

## 2017-02-03 MED ORDER — ACETAMINOPHEN 325 MG PO TABS
650.0000 mg | ORAL_TABLET | Freq: Once | ORAL | Status: AC
  Administered 2017-02-03: 650 mg via ORAL
  Filled 2017-02-03: qty 2

## 2017-02-03 NOTE — ED Provider Notes (Signed)
[facility] DEPT Provider Note   CSN: [id number] Arrival date & time: 02/03/17  1801     History   Chief Complaint Chief Complaint  Patient presents with  . Foot Pain    HPI Daniel Arias is a 41 y.o. male.  Patient is a 41 year old male who presents to the emergency department with a complaint of left foot pain.  The patient states that he was helping of [employer] in the yard 3 days ago. The yard is unlevel, and the patient was helping to move debris from a tree that had been cut. The patient states that he had leg pain during the day and after thorough work time. Later he noticed that he had insect bites of his foot. The insect bites have mostly resolved, but he says that he still has pain that involves the side and bottom of his foot. He presents now to have this evaluated.      Past Medical History:  Diagnosis Date  . Diabetes mellitus without complication ([place])   . GERD (gastroesophageal reflux disease)   . Hypercholesteremia   . Hypertension   . Prediabetes   . Schizophrenia, paranoid ([place])   . Sleep apnea   . Sleep apnea 06/24/2016  . Substance abuse     Patient Active Problem List   Diagnosis Date Noted  . Vitamin D deficiency 09/29/2016  . Nonalcoholic steatohepatitis ([facility]) 09/24/2016  . Type 2 diabetes mellitus ([place]) 07/06/2016  . Essential hypertension 06/24/2016  . HLD (hyperlipidemia) 06/24/2016  . Paranoid schizophrenia ([place]) 06/24/2016  . Sleep apnea 06/24/2016  . Tobacco abuse 06/24/2016  . Lung nodule seen on imaging study- benign 06/24/2016  . Obesity 06/24/2016    History reviewed. No pertinent surgical history.     Home Medications    Prior to Admission medications   Medication Sig Start Date End Date Taking? Authorizing Provider  albuterol (PROVENTIL HFA;VENTOLIN HFA) 108 (90 Base) MCG/ACT inhaler Inhale 2 puffs into the lungs every 6 (six) hours as needed for wheezing or shortness of breath. 08/03/16  Yes Raylene Everts, MD    ARIPiprazole (ABILIFY) 30 MG tablet Take 30 mg by mouth daily.   Yes [provider]  atorvastatin (LIPITOR) 40 MG tablet Take 1 tablet (40 mg total) by mouth daily. 09/29/16  Yes Raylene Everts, MD  hydrochlorothiazide (HYDRODIURIL) 25 MG tablet TAKE 1 TABLET (25 MG TOTAL) BY MOUTH DAILY. 12/21/16  Yes Raylene Everts, MD  losartan (COZAAR) 50 MG tablet TAKE 1 TABLET (50 MG TOTAL) BY MOUTH DAILY. 01/22/17  Yes Fayrene Helper, MD  metFORMIN (GLUCOPHAGE-XR) 500 MG 24 hr tablet Take 2 tablets twice a day 05/20/16  Yes [provider]  Vitamin D, Ergocalciferol, (DRISDOL) 50000 units CAPS capsule Take 1 capsule (50,000 Units total) by mouth every 7 (seven) days. 09/29/16  Yes Raylene Everts, MD    Family History Family History  Problem Relation Age of Onset  . Diabetes Mother   . Mental illness Mother     Social History Social History  Substance Use Topics  . Smoking status: Current Every Day Smoker    Packs/day: 0.25    Types: Cigarettes  . Smokeless tobacco: Never Used  . Alcohol use Yes     Comment: occ.     Allergies   Patient has no known allergies.   Review of Systems Review of Systems  Constitutional: Negative for activity change.       All ROS Neg except as noted in HPI  HENT: Negative for nosebleeds.   Eyes: Negative for photophobia and discharge.  Respiratory: Negative for cough, shortness of breath and wheezing.   Cardiovascular: Negative for chest pain and palpitations.  Gastrointestinal: Negative for abdominal pain and blood in stool.  Genitourinary: Negative for dysuria, frequency and hematuria.  Musculoskeletal: Positive for arthralgias and myalgias. Negative for back pain and neck pain.  Skin: Negative.   Neurological: Negative for dizziness, seizures and speech difficulty.  Psychiatric/Behavioral: Negative for confusion and hallucinations.     Physical Exam Updated Vital Signs BP (!) 144/88 (BP Location: Right Arm)   Pulse  96   Temp 97.6 F (36.4 C) (Oral)   Ht 6' 2"  (1.88 m)   Wt 136.1 kg (300 lb)   SpO2 100%   BMI 38.52 kg/m   Physical Exam  Constitutional: Vital signs are normal. He appears well-developed and well-nourished. He is active.  HENT:  Head: Normocephalic and atraumatic.  Right Ear: Tympanic membrane, external ear and ear canal normal.  Left Ear: Tympanic membrane, external ear and ear canal normal.  Nose: Nose normal.  Mouth/Throat: Uvula is midline, oropharynx is clear and moist and mucous membranes are normal.  Eyes: Conjunctivae, EOM and lids are normal. Pupils are equal, round, and reactive to light.  Neck: Trachea normal, normal range of motion and phonation normal. Neck supple. Carotid bruit is not present.  Cardiovascular: Normal rate, regular rhythm and normal pulses.   Abdominal: Soft. Normal appearance and bowel sounds are normal.  Musculoskeletal:       Left ankle: He exhibits no swelling and no deformity. Tenderness. Lateral malleolus tenderness found. Achilles tendon normal.  Dorsalis pedis and posterior tibial pulses are 2+. Capillary refill is less than 2 seconds. No palpable deformity of the left ankle or the left foot.  Lymphadenopathy:       Head (right side): No submental, no preauricular and no posterior auricular adenopathy present.       Head (left side): No submental, no preauricular and no posterior auricular adenopathy present.    He has no cervical adenopathy.  Neurological: He is alert. He has normal strength. No cranial nerve deficit or sensory deficit. GCS eye subscore is 4. GCS verbal subscore is 5. GCS motor subscore is 6.  Skin: Skin is warm and dry.  Psychiatric: His speech is normal.     ED Treatments / Results  Labs (all labs ordered are listed, but only abnormal results are displayed) Labs Reviewed - No data to display  EKG  EKG Interpretation None       Radiology No results found.  Procedures Procedures (including critical care  time)  SPlint. Splint was applied by nursing staff. After the splint was applied. The examination reveals the patient have capillary refill of last 2 seconds. There is good range of motion of the toes. There no temperature changes of the lower extremity. Patient tolerated the procedure without problem. Medications Ordered in ED Medications - No data to display   Initial Impression / Assessment and Plan / ED Course  I have reviewed the triage vital signs and the nursing notes.  Pertinent labs & imaging results that were available during my care of the patient were reviewed by me and considered in my medical decision making (see chart for details).       Final Clinical Impressions(s) / ED Diagnoses MDM Vital signs within normal limits. The examination of the foot and ankle suggest ankle strain/sprain. Patient is fitted with an ankle stirrup splint. He will  be treated with Tylenol and ibuprofen for soreness. He will follow-up with Dr. Meda Coffee for additional evaluation if not improving.    Final diagnoses:  Sprain of other ligament of ankle, unspecified laterality, sequela    New Prescriptions New Prescriptions   IBUPROFEN (ADVIL,MOTRIN) 600 MG TABLET    Take 1 tablet (600 mg total) by mouth 4 (four) times daily.     Lily Kocher, PA-C 02/03/17 Hortencia Pilar, MD 02/03/17 [id number]

## 2017-02-03 NOTE — ED Triage Notes (Signed)
Pt was doing yard work 3 days ago when he began having left leg pain. Later he noticed 2 bite marks to his left foot. Both areas have now resolved but he continues to have pain to the bottom and side of his foot.

## 2017-02-03 NOTE — Discharge Instructions (Signed)
Your vital signs within normal limits. Your examination favors a strain/sprain of your ankle. Please use the ankle splint over the next 7 days. Use 600 mg of ibuprofen and 500 mg of Tylenol with breakfast, lunch, dinner, and at bedtime. Please see Dr. Meda Coffee for additional evaluation if not improving.

## 2017-02-11 ENCOUNTER — Telehealth: Payer: Self-pay | Admitting: *Deleted

## 2017-02-11 NOTE — Telephone Encounter (Signed)
Referral for sleep

## 2017-02-19 ENCOUNTER — Telehealth: Payer: Self-pay | Admitting: *Deleted

## 2017-02-19 NOTE — Telephone Encounter (Signed)
Patient is scheduled to see Dr Merlene Laughter on 7/13/18at 11:00

## 2017-02-27 ENCOUNTER — Other Ambulatory Visit: Payer: Self-pay | Admitting: Family Medicine

## 2017-03-01 NOTE — Telephone Encounter (Signed)
Seen 5 1 18

## 2017-03-25 ENCOUNTER — Encounter: Payer: Self-pay | Admitting: Family Medicine

## 2017-03-25 ENCOUNTER — Ambulatory Visit (INDEPENDENT_AMBULATORY_CARE_PROVIDER_SITE_OTHER): Payer: Medicaid Other | Admitting: Family Medicine

## 2017-03-25 VITALS — BP 120/72 | HR 93 | Temp 97.5°F | Resp 16 | Ht 74.0 in | Wt 294.2 lb

## 2017-03-25 DIAGNOSIS — E119 Type 2 diabetes mellitus without complications: Secondary | ICD-10-CM

## 2017-03-25 DIAGNOSIS — I10 Essential (primary) hypertension: Secondary | ICD-10-CM

## 2017-03-25 DIAGNOSIS — Z720 Tobacco use: Secondary | ICD-10-CM

## 2017-03-25 DIAGNOSIS — E785 Hyperlipidemia, unspecified: Secondary | ICD-10-CM

## 2017-03-25 NOTE — Progress Notes (Signed)
Chief Complaint  Patient presents with  . Follow-up   Still seeing [facility] counselor [place] well , is still trying to walk daily Has lost another 6 lbs Smokes 3 cig a day.  Still trying to quit.  This is big improvement BP is good Will come back for fasting labs  Patient Active Problem List   Diagnosis Date Noted  . Vitamin D deficiency 09/29/2016  . Nonalcoholic steatohepatitis ([facility]) 09/24/2016  . Type 2 diabetes mellitus ([place]) 07/06/2016  . Essential hypertension 06/24/2016  . HLD (hyperlipidemia) 06/24/2016  . Paranoid schizophrenia ([place]) 06/24/2016  . Sleep apnea 06/24/2016  . Tobacco abuse 06/24/2016  . Lung nodule seen on imaging study- benign 06/24/2016  . Obesity 06/24/2016    Outpatient Encounter Prescriptions as of 03/25/2017  Medication Sig  . albuterol (PROVENTIL HFA;VENTOLIN HFA) 108 (90 Base) MCG/ACT inhaler Inhale 2 puffs into the lungs every 6 (six) hours as needed for wheezing or shortness of breath.  . ARIPiprazole (ABILIFY) 30 MG tablet Take 30 mg by mouth daily.  Marland Kitchen atorvastatin (LIPITOR) 40 MG tablet Take 1 tablet (40 mg total) by mouth daily.  . hydrochlorothiazide (HYDRODIURIL) 25 MG tablet TAKE 1 TABLET (25 MG TOTAL) BY MOUTH DAILY.  Marland Kitchen ibuprofen (ADVIL,MOTRIN) 600 MG tablet Take 1 tablet (600 mg total) by mouth 4 (four) times daily.  Marland Kitchen losartan (COZAAR) 50 MG tablet TAKE 1 TABLET (50 MG TOTAL) BY MOUTH DAILY.  . metFORMIN (GLUCOPHAGE-XR) 500 MG 24 hr tablet TAKE 2 TABLETS (1,000 MG) BY MOUTH 2 TIMES PER DAY WITH MEALS  . Vitamin D, Ergocalciferol, (DRISDOL) 50000 units CAPS capsule Take 1 capsule (50,000 Units total) by mouth every 7 (seven) days.   No facility-administered encounter medications on file as of 03/25/2017.     No Known Allergies  Review of Systems  Constitutional: Positive for unexpected weight change. Negative for activity change and appetite change.       Loss  HENT: Negative for congestion and dental problem.   Eyes: Negative for  photophobia and visual disturbance.  Respiratory: Negative for cough and shortness of breath.   Cardiovascular: Negative for chest pain, palpitations and leg swelling.  Gastrointestinal: Negative for constipation and diarrhea.  Genitourinary: Negative for difficulty urinating and frequency.  Musculoskeletal: Negative for arthralgias and back pain.  Neurological: Negative for dizziness and headaches.  Psychiatric/Behavioral: Negative for dysphoric mood and sleep disturbance. The patient is not nervous/anxious.        Sleeps well with CPAP    BP 120/72 (BP Location: Left Arm, Patient Position: Sitting, Cuff Size: Normal)   Pulse 93   Temp (!) 97.5 F (36.4 C) (Other (Comment))   Resp 16   Ht 6' 2"  (1.88 m)   Wt 294 lb 4 oz (133.5 kg)   SpO2 97%   BMI 37.78 kg/m   Physical Exam  Constitutional: He appears well-developed and well-nourished.  overweight  HENT:  Head: Normocephalic and atraumatic.  Mouth/Throat: Oropharynx is clear and moist.  Dental disease  Eyes: Pupils are equal, round, and reactive to light. Conjunctivae are normal.  Neck: Normal range of motion. Neck supple. No thyromegaly present.  Cardiovascular: Normal rate, regular rhythm and normal heart sounds.   Pulmonary/Chest: Effort normal and breath sounds normal. No respiratory distress.  Abdominal: Soft. Bowel sounds are normal.  Musculoskeletal: Normal range of motion. He exhibits no edema.  Lymphadenopathy:    He has no cervical adenopathy.  Neurological: He is alert.  Gait normal  Skin: Skin is warm  and dry.  Psychiatric: He has a normal mood and affect. His behavior is normal.   Some slow responses. Polite and cooperative.  Nursing note and vitals reviewed.   ASSESSMENT/PLAN:  1. Tobacco abuse Improved  2. Hyperlipidemia, unspecified hyperlipidemia type On statin, needs labs  3. Type 2 diabetes mellitus without complication, without long-term current use of insulin (HCC) Compliant.  4. Essential  hypertension   Patient Instructions  I am glad you are quitting smoking  Keep on same medicines  See me in 3 months Need labs work next time   Raylene Everts, MD

## 2017-03-25 NOTE — Patient Instructions (Signed)
I am glad you are quitting smoking  Keep on same medicines  See me in 3 months Need labs work next time

## 2017-06-25 ENCOUNTER — Ambulatory Visit (INDEPENDENT_AMBULATORY_CARE_PROVIDER_SITE_OTHER): Payer: Medicaid Other | Admitting: Family Medicine

## 2017-06-25 ENCOUNTER — Encounter: Payer: Self-pay | Admitting: Family Medicine

## 2017-06-25 VITALS — BP 122/74 | HR 97 | Resp 15 | Ht 74.0 in | Wt 291.0 lb

## 2017-06-25 DIAGNOSIS — E785 Hyperlipidemia, unspecified: Secondary | ICD-10-CM

## 2017-06-25 DIAGNOSIS — Z720 Tobacco use: Secondary | ICD-10-CM | POA: Diagnosis not present

## 2017-06-25 DIAGNOSIS — E559 Vitamin D deficiency, unspecified: Secondary | ICD-10-CM | POA: Diagnosis not present

## 2017-06-25 DIAGNOSIS — I10 Essential (primary) hypertension: Secondary | ICD-10-CM | POA: Diagnosis not present

## 2017-06-25 DIAGNOSIS — E119 Type 2 diabetes mellitus without complications: Secondary | ICD-10-CM

## 2017-06-25 DIAGNOSIS — H60501 Unspecified acute noninfective otitis externa, right ear: Secondary | ICD-10-CM

## 2017-06-25 DIAGNOSIS — Z23 Encounter for immunization: Secondary | ICD-10-CM | POA: Diagnosis not present

## 2017-06-25 MED ORDER — HYDROCHLOROTHIAZIDE 25 MG PO TABS: 25.0000 mg | ORAL_TABLET | Freq: Every day | ORAL | 1 refills | Status: DC

## 2017-06-25 MED ORDER — NEOMYCIN-POLYMYXIN-HC 3.5-10000-1 OT SOLN: 3.0000 [drp] | Freq: Four times a day (QID) | OTIC | 0 refills | Status: DC

## 2017-06-25 NOTE — Progress Notes (Signed)
Chief Complaint  Patient presents with  . Diabetes    follow up visit   . Hypertension  . Ear Problem    right ear itchy for the past few days but no pain or drainage    Patient is here for routine follow-up. Continues to slowly lose weight.  He states he tries to walk every day. Here we will do lab work today to check on his diabetes care.  He states his sugars have been good.  Fasting sugar this morning was in the 90s. Blood pressures well controlled. He is compliant with his medications.  He is on a statin for cholesterol. He goes regularly to try mental health provider.  He is compliant with medications. He continues to smoke cigarettes.  He is slowly reducing.  He has called the [place] quit line and taken their advice.  He has patches coming in the mail. He has itchiness in his right ear.  Sometimes it feels clogged.  He would like this looked at.  It has been going on for 2 or 3 weeks. He has sleep apnea.  He is compliant with CPAP.  This does help him sleep  Patient Active Problem List   Diagnosis Date Noted  . Vitamin D deficiency 09/29/2016  . Nonalcoholic steatohepatitis (NASH) 09/24/2016  . Type 2 diabetes mellitus ([place]) 07/06/2016  . Essential hypertension 06/24/2016  . HLD (hyperlipidemia) 06/24/2016  . Paranoid schizophrenia ([place]) 06/24/2016  . Sleep apnea 06/24/2016  . Tobacco abuse 06/24/2016  . Lung nodule seen on imaging study- benign 06/24/2016  . Obesity 06/24/2016    Outpatient Encounter Prescriptions as of 06/25/2017  Medication Sig  . albuterol (PROVENTIL HFA;VENTOLIN HFA) 108 (90 Base) MCG/ACT inhaler Inhale 2 puffs into the lungs every 6 (six) hours as needed for wheezing or shortness of breath.  . ARIPiprazole (ABILIFY) 30 MG tablet Take 30 mg by mouth daily.  Marland Kitchen atorvastatin (LIPITOR) 40 MG tablet Take 1 tablet (40 mg total) by mouth daily.  . hydrochlorothiazide (HYDRODIURIL) 25 MG tablet Take 1 tablet (25 mg total) by mouth daily.  Marland Kitchen  losartan (COZAAR) 50 MG tablet TAKE 1 TABLET (50 MG TOTAL) BY MOUTH DAILY.  . metFORMIN (GLUCOPHAGE-XR) 500 MG 24 hr tablet TAKE 2 TABLETS (1,000 MG) BY MOUTH 2 TIMES PER DAY WITH MEALS  . Vitamin D, Ergocalciferol, (DRISDOL) 50000 units CAPS capsule Take 1 capsule (50,000 Units total) by mouth every 7 (seven) days.  . [DISCONTINUED] hydrochlorothiazide (HYDRODIURIL) 25 MG tablet TAKE 1 TABLET (25 MG TOTAL) BY MOUTH DAILY.  Marland Kitchen neomycin-polymyxin-hydrocortisone (CORTISPORIN) OTIC solution Place 3 drops into the right ear 4 (four) times daily.  . [DISCONTINUED] ibuprofen (ADVIL,MOTRIN) 600 MG tablet Take 1 tablet (600 mg total) by mouth 4 (four) times daily.   No facility-administered encounter medications on file as of 06/25/2017.     No Known Allergies  Review of Systems  Constitutional: Positive for unexpected weight change. Negative for activity change and appetite change.       Loss  HENT: Positive for ear discharge. Negative for congestion and dental problem.   Eyes: Negative for photophobia and visual disturbance.  Respiratory: Negative for cough and shortness of breath.   Cardiovascular: Negative for chest pain, palpitations and leg swelling.  Gastrointestinal: Negative for constipation and diarrhea.  Genitourinary: Negative for difficulty urinating and frequency.  Musculoskeletal: Negative for arthralgias and back pain.  Neurological: Negative for dizziness and headaches.  Psychiatric/Behavioral: Negative for dysphoric mood and sleep disturbance. The patient is  not nervous/anxious.        Sleeps well with CPAP    BP 122/74   Pulse 97   Resp 15   Ht 6' 2"  (1.88 m)   Wt 291 lb (132 kg)   SpO2 96%   BMI 37.36 kg/m   Physical Exam  Constitutional: He appears well-developed and well-nourished.  overweight  HENT:  Head: Normocephalic and atraumatic.  Left Ear: External ear normal.  Mouth/Throat: Oropharynx is clear and moist.  Dental disease, poor oral hygiene.  Right  external ear canal has some discharge and there is scaling at the opening.  Pain with movement of pinna.  Early otitis externa.  Eyes: Pupils are equal, round, and reactive to light. Conjunctivae are normal.  Neck: Normal range of motion. Neck supple. No thyromegaly present.  Cardiovascular: Normal rate, regular rhythm and normal heart sounds.   Pulmonary/Chest: Effort normal and breath sounds normal. No respiratory distress.  Abdominal: Soft. Bowel sounds are normal.  Musculoskeletal: Normal range of motion. He exhibits no edema.  Lymphadenopathy:    He has no cervical adenopathy.  Neurological: He is alert.  Gait normal  Skin: Skin is warm and dry.  Psychiatric: He has a normal mood and affect. His behavior is normal.   Some slow responses. Polite and cooperative.  Nursing note and vitals reviewed.   ASSESSMENT/PLAN:  1. Tobacco abuse Patient is attempting to quit.  He is congratulated  2. Hyperlipidemia, unspecified hyperlipidemia type Compliant with Lipitor.  Check lipids today  3. Type 2 diabetes mellitus without complication, without long-term current use of insulin (HCC) Compliant with medicines and diet.  Losing weight.  Will check A1c today - CBC - COMPLETE METABOLIC PANEL WITH GFR - Hemoglobin A1c - Lipid panel - Urinalysis, Routine w reflex microscopic - Microalbumin / creatinine urine ratio  4. Essential hypertension Well-controlled  5. Vitamin D deficiency On replacement.  Will check level - VITAMIN D 25 Hydroxy (Vit-D Deficiency, Fractures)  6. Need for immunization against influenza Done today - Flu Vaccine QUAD 36+ mos IM 7.  Otitis externa Prescription for Cortisporin drops  Patient Instructions  Flu shot today  Need blood tests today  No change in medicines  I am glad you are trying to quit smoking  See me in 6 months     Raylene Everts, MD

## 2017-06-25 NOTE — Patient Instructions (Signed)
Flu shot today  Need blood tests today  No change in medicines  I am glad you are trying to quit smoking  See me in 6 months

## 2017-06-26 LAB — COMPLETE METABOLIC PANEL WITH GFR
AG Ratio: 1.6 (calc) (ref 1.0–2.5)
ALKALINE PHOSPHATASE (APISO): 79 U/L (ref 40–115)
ALT: 13 U/L (ref 9–46)
AST: 14 U/L (ref 10–40)
Albumin: 4.8 g/dL (ref 3.6–5.1)
BUN: 8 mg/dL (ref 7–25)
CALCIUM: 10.4 mg/dL — AB (ref 8.6–10.3)
CO2: 29 mmol/L (ref 20–32)
CREATININE: 0.88 mg/dL (ref 0.60–1.35)
Chloride: 101 mmol/L (ref 98–110)
GFR, EST AFRICAN AMERICAN: 122 mL/min/{1.73_m2} (ref >=60)
GFR, EST NON AFRICAN AMERICAN: 105 mL/min/{1.73_m2} (ref >=60)
GLOBULIN: 3 g/dL (ref 1.9–3.7)
GLUCOSE: 106 mg/dL (ref 65–139)
Potassium: 3.8 mmol/L (ref 3.5–5.3)
Sodium: 141 mmol/L (ref 135–146)
Total Bilirubin: 0.3 mg/dL (ref 0.2–1.2)
Total Protein: 7.8 g/dL (ref 6.1–8.1)

## 2017-06-26 LAB — CBC
HEMATOCRIT: 40.1 % (ref 38.5–50.0)
Hemoglobin: 13.7 g/dL (ref 13.2–17.1)
MCH: 27 pg (ref 27.0–33.0)
MCHC: 34.2 g/dL (ref 32.0–36.0)
MCV: 78.9 fL — ABNORMAL LOW (ref 80.0–100.0)
MPV: 10.3 fL (ref 7.5–12.5)
PLATELETS: 381 10*3/uL (ref 140–400)
RBC: 5.08 10*6/uL (ref 4.20–5.80)
RDW: 14.1 % (ref 11.0–15.0)
WBC: 15.4 10*3/uL — AB (ref 3.8–10.8)

## 2017-06-26 LAB — URINALYSIS, ROUTINE W REFLEX MICROSCOPIC
BACTERIA UA: NONE SEEN /HPF
Bilirubin Urine: NEGATIVE
Glucose, UA: NEGATIVE
HGB URINE DIPSTICK: NEGATIVE
HYALINE CAST: NONE SEEN /LPF
Ketones, ur: NEGATIVE
Leukocytes, UA: NEGATIVE
NITRITE: NEGATIVE
PH: 5.5 (ref 5.0–8.0)
RBC / HPF: NONE SEEN /HPF (ref 0–2)
Specific Gravity, Urine: 1.018 (ref 1.001–1.03)
Squamous Epithelial / LPF: NONE SEEN /HPF (ref <=5)
WBC, UA: NONE SEEN /HPF (ref 0–5)

## 2017-06-26 LAB — LIPID PANEL
Cholesterol: 91 mg/dL (ref <200)
HDL: 27 mg/dL — ABNORMAL LOW (ref >40)
Non-HDL Cholesterol (Calc): 64 mg/dL (calc) (ref <130)
TRIGLYCERIDES: 539 mg/dL — AB (ref <150)
Total CHOL/HDL Ratio: 3.4 (calc) (ref <5.0)

## 2017-06-26 LAB — MICROALBUMIN / CREATININE URINE RATIO
Creatinine, Urine: 171 mg/dL (ref 20–320)
MICROALB UR: 22.3 mg/dL
MICROALB/CREAT RATIO: 130 ug/mg{creat} — AB (ref <30)

## 2017-06-26 LAB — HEMOGLOBIN A1C
HEMOGLOBIN A1C: 6.3 %{Hb} — AB (ref <5.7)
MEAN PLASMA GLUCOSE: 134 (calc)
eAG (mmol/L): 7.4 (calc)

## 2017-06-26 LAB — VITAMIN D 25 HYDROXY (VIT D DEFICIENCY, FRACTURES): Vit D, 25-Hydroxy: 21 ng/mL — ABNORMAL LOW (ref 30–100)

## 2017-06-29 ENCOUNTER — Encounter: Payer: Self-pay | Admitting: Family Medicine

## 2017-07-21 ENCOUNTER — Other Ambulatory Visit: Payer: Self-pay | Admitting: Family Medicine

## 2017-09-08 ENCOUNTER — Other Ambulatory Visit: Payer: Self-pay | Admitting: Family Medicine

## 2017-09-09 NOTE — Telephone Encounter (Signed)
Seen 11 2 18

## 2017-09-10 ENCOUNTER — Other Ambulatory Visit: Payer: Self-pay | Admitting: Family Medicine

## 2017-09-10 NOTE — Telephone Encounter (Signed)
Patient is requesting a prescription for a rescue inhaler He uses [facility] [address] in [place]  Cb#: [phone]

## 2017-09-13 MED ORDER — ALBUTEROL SULFATE HFA 108 (90 BASE) MCG/ACT IN AERS: 2.0000 | INHALATION_SPRAY | Freq: Four times a day (QID) | RESPIRATORY_TRACT | 0 refills | Status: DC | PRN

## 2017-10-11 ENCOUNTER — Encounter (HOSPITAL_COMMUNITY): Payer: Self-pay | Admitting: Emergency Medicine

## 2017-10-11 ENCOUNTER — Other Ambulatory Visit: Payer: Self-pay

## 2017-10-11 DIAGNOSIS — S39012A Strain of muscle, fascia and tendon of lower back, initial encounter: Secondary | ICD-10-CM | POA: Diagnosis not present

## 2017-10-11 DIAGNOSIS — Y999 Unspecified external cause status: Secondary | ICD-10-CM | POA: Insufficient documentation

## 2017-10-11 DIAGNOSIS — Y939 Activity, unspecified: Secondary | ICD-10-CM | POA: Diagnosis not present

## 2017-10-11 DIAGNOSIS — R0981 Nasal congestion: Secondary | ICD-10-CM | POA: Insufficient documentation

## 2017-10-11 DIAGNOSIS — F17210 Nicotine dependence, cigarettes, uncomplicated: Secondary | ICD-10-CM | POA: Insufficient documentation

## 2017-10-11 DIAGNOSIS — I10 Essential (primary) hypertension: Secondary | ICD-10-CM | POA: Insufficient documentation

## 2017-10-11 DIAGNOSIS — X58XXXA Exposure to other specified factors, initial encounter: Secondary | ICD-10-CM | POA: Insufficient documentation

## 2017-10-11 DIAGNOSIS — Y929 Unspecified place or not applicable: Secondary | ICD-10-CM | POA: Insufficient documentation

## 2017-10-11 DIAGNOSIS — R51 Headache: Secondary | ICD-10-CM | POA: Diagnosis present

## 2017-10-11 DIAGNOSIS — Z79899 Other long term (current) drug therapy: Secondary | ICD-10-CM | POA: Insufficient documentation

## 2017-10-11 DIAGNOSIS — R42 Dizziness and giddiness: Secondary | ICD-10-CM | POA: Insufficient documentation

## 2017-10-11 NOTE — ED Triage Notes (Signed)
Pt c/o headache x 3 days with dizziness. Pt denies any pain at this time, but states he is dizzy.

## 2017-10-12 ENCOUNTER — Emergency Department (HOSPITAL_COMMUNITY)
Admission: EM | Admit: 2017-10-12 | Discharge: 2017-10-12 | Disposition: A | Discharge status: Home / Self Care | Payer: Medicaid Other | Attending: Emergency Medicine | Admitting: Emergency Medicine

## 2017-10-12 DIAGNOSIS — R0981 Nasal congestion: Secondary | ICD-10-CM

## 2017-10-12 DIAGNOSIS — R42 Dizziness and giddiness: Secondary | ICD-10-CM

## 2017-10-12 DIAGNOSIS — S39012A Strain of muscle, fascia and tendon of lower back, initial encounter: Secondary | ICD-10-CM

## 2017-10-12 MED ORDER — FLUTICASONE PROPIONATE 50 MCG/ACT NA SUSP: 1.0000 | Freq: Every day | NASAL | 0 refills | Status: DC

## 2017-10-12 MED ORDER — CYCLOBENZAPRINE HCL 10 MG PO TABS: 10.0000 mg | ORAL_TABLET | Freq: Three times a day (TID) | ORAL | 0 refills | Status: DC | PRN

## 2017-10-12 NOTE — ED Notes (Signed)
Pt states headache on Sunday. Pt says he is now complaining of being dizzy. Pt ambulated to exam room w/o gait issues.

## 2017-10-12 NOTE — ED Provider Notes (Signed)
[facility] EMERGENCY DEPARTMENT Provider Note   CSN: [id number] Arrival date & time: 10/11/17  2217     History   Chief Complaint Chief Complaint  Patient presents with  . Dizziness    HPI Daniel Arias is a 42 y.o. male.  Patient presents with multiple complaints.  He reports that one day ago he had a headache that lasted for several hours.  Pain was in both temples.  He reports that it was throbbing in nature but resolved on its own.  He did not take any over-the-counter medications for the headache.  Since then he has had some mild dizziness.  Dizziness is not vertiginous.  No vision change.  No heart palpitations, chest pain, shortness of breath, passing out.  Left ear has been itchy.  He has noticed that he has sinus congestion.  He has not had a cough.  He denies fever, neck pain and stiffness.  Patient does report that he has noticed some slight pain in his lower back.  He denies any injury.  Pain does not radiate.  No change in bowel or bladder function.    Dizziness  Associated symptoms: headaches     Past Medical History:  Diagnosis Date  . Diabetes mellitus without complication ([place])   . GERD (gastroesophageal reflux disease)   . Hypercholesteremia   . Hypertension   . Prediabetes   . Schizophrenia, paranoid ([place])   . Sleep apnea   . Sleep apnea 06/24/2016  . Substance abuse [facility])     Patient Active Problem List   Diagnosis Date Noted  . Vitamin D deficiency 09/29/2016  . Nonalcoholic steatohepatitis (NASH) 09/24/2016  . Type 2 diabetes mellitus ([place]) 07/06/2016  . Essential hypertension 06/24/2016  . HLD (hyperlipidemia) 06/24/2016  . Paranoid schizophrenia ([place]) 06/24/2016  . Sleep apnea 06/24/2016  . Tobacco abuse 06/24/2016  . Lung nodule seen on imaging study- benign 06/24/2016  . Obesity 06/24/2016    History reviewed. No pertinent surgical history.     Home Medications    Prior to Admission medications   Medication Sig Start Date End Date  Taking? Authorizing Provider  albuterol (PROVENTIL HFA;VENTOLIN HFA) 108 (90 Base) MCG/ACT inhaler Inhale 2 puffs into the lungs every 6 (six) hours as needed for wheezing or shortness of breath. 09/13/17   Raylene Everts, MD  ARIPiprazole (ABILIFY) 30 MG tablet Take 30 mg by mouth daily.    [provider]  atorvastatin (LIPITOR) 40 MG tablet Take 1 tablet (40 mg total) by mouth daily. 09/29/16   Raylene Everts, MD  cyclobenzaprine (FLEXERIL) 10 MG tablet Take 1 tablet (10 mg total) by mouth 3 (three) times daily as needed for muscle spasms. 10/12/17   Orpah Greek, MD  fluticasone (FLONASE) 50 MCG/ACT nasal spray Place 1 spray into both nostrils daily. 10/12/17   Orpah Greek, MD  hydrochlorothiazide (HYDRODIURIL) 25 MG tablet Take 1 tablet (25 mg total) by mouth daily. 06/25/17   Raylene Everts, MD  losartan (COZAAR) 50 MG tablet TAKE 1 TABLET BY MOUTH EVERY DAY 07/22/17   Raylene Everts, MD  metFORMIN (GLUCOPHAGE-XR) 500 MG 24 hr tablet TAKE 2 TABLETS BY MOUTH 2 TIMES PER DAY WITH MEALS 09/09/17   Raylene Everts, MD  neomycin-polymyxin-hydrocortisone (CORTISPORIN) OTIC solution Place 3 drops into the right ear 4 (four) times daily. 06/25/17   Raylene Everts, MD  Vitamin D, Ergocalciferol, (DRISDOL) 50000 units CAPS capsule Take 1 capsule (50,000 Units total) by mouth every  7 (seven) days. 09/29/16   Raylene Everts, MD    Family History Family History  Problem Relation Age of Onset  . Diabetes Mother   . Mental illness Mother     Social History Social History   Tobacco Use  . Smoking status: Current Every Day Smoker    Packs/day: 0.25    Types: Cigarettes  . Smokeless tobacco: Never Used  Substance Use Topics  . Alcohol use: Yes    Comment: occ.  . Drug use: No    Comment: every 2 weeks     Allergies   Patient has no known allergies.   Review of Systems Review of Systems  HENT: Positive for congestion.   Neurological:  Positive for dizziness and headaches.  All other systems reviewed and are negative.    Physical Exam Updated Vital Signs BP (!) 157/79 (BP Location: Right Arm)   Pulse 100   Temp (!) 97.4 F (36.3 C) (Oral)   Resp 16   Ht 6' 2"  (1.88 m)   Wt 132.9 kg (293 lb)   SpO2 99%   BMI 37.62 kg/m   Physical Exam  Constitutional: He is oriented to person, place, and time. He appears well-developed and well-nourished. No distress.  HENT:  Head: Normocephalic and atraumatic.  Right Ear: Hearing and tympanic membrane normal.  Left Ear: Hearing and tympanic membrane normal.  Nose: Nose normal.  Mouth/Throat: Oropharynx is clear and moist and mucous membranes are normal.  Eyes: Conjunctivae and EOM are normal. Pupils are equal, round, and reactive to light.  Neck: Normal range of motion. Neck supple.  Cardiovascular: Regular rhythm, S1 normal and S2 normal. Exam reveals no gallop and no friction rub.  No murmur heard. Pulmonary/Chest: Effort normal and breath sounds normal. No respiratory distress. He exhibits no tenderness.  Abdominal: Soft. Normal appearance and bowel sounds are normal. There is no hepatosplenomegaly. There is no tenderness. There is no rebound, no guarding, no tenderness at McBurney's point and negative Murphy's sign. No hernia.  Musculoskeletal: Normal range of motion.       Lumbar back: He exhibits tenderness.       Back:  Neurological: He is alert and oriented to person, place, and time. He has normal strength. No cranial nerve deficit or sensory deficit. Coordination normal. GCS eye subscore is 4. GCS verbal subscore is 5. GCS motor subscore is 6.  Skin: Skin is warm, dry and intact. No rash noted. No cyanosis.  Psychiatric: He has a normal mood and affect. His speech is normal and behavior is normal. Thought content normal.  Nursing note and vitals reviewed.    ED Treatments / Results  Labs (all labs ordered are listed, but only abnormal results are  displayed) Labs Reviewed - No data to display  EKG  EKG Interpretation None       Radiology No results found.  Procedures Procedures (including critical care time)  Medications Ordered in ED Medications - No data to display   Initial Impression / Assessment and Plan / ED Course  I have reviewed the triage vital signs and the nursing notes.  Pertinent labs & imaging results that were available during my care of the patient were reviewed by me and considered in my medical decision making (see chart for details).     Patient presents with multiple complaints.  He had a headache more than 1 day ago but it spontaneously resolved and has not come back.  He does complain of some mild dizziness, however,  has normal neurologic function.  Examination is nonfocal, no unilateral abnormalities, his neurologic function is normal.  No concern for any significant intracranial problem.  Patient does have a history of diabetes, he checks his blood sugar immediately before coming here and it was 189.  No concern for diabetic complications.  Patient reassured, he does have some sinus congestion, symptoms likely secondary to his sinuses.  No further workup necessary.  He is complaining of low back pain.  This is nontraumatic.  He has normal range of motion.  No lower extremity radicular pain, normal sensation and motor function.  Does not require any further workup.  Patient will be treated symptomatically.  Final Clinical Impressions(s) / ED Diagnoses   Final diagnoses:  Dizziness  Sinus congestion  Low back strain, initial encounter    ED Discharge Orders        Ordered    fluticasone (FLONASE) 50 MCG/ACT nasal spray  Daily     10/12/17 0044    cyclobenzaprine (FLEXERIL) 10 MG tablet  3 times daily PRN     10/12/17 0044       Orpah Greek, MD 10/12/17 [phone]

## 2017-10-12 NOTE — ED Notes (Signed)
Pt alert & oriented x4, stable gait. Patient given discharge instructions, paperwork & prescription(s). Patient  instructed to stop at the registration desk to finish any additional paperwork. Patient verbalized understanding. Pt left department w/ no further questions. 

## 2017-11-01 ENCOUNTER — Encounter: Payer: Self-pay | Admitting: Family Medicine

## 2017-11-05 ENCOUNTER — Other Ambulatory Visit: Payer: Self-pay | Admitting: Family Medicine

## 2017-12-24 ENCOUNTER — Ambulatory Visit: Payer: Medicaid Other | Admitting: Family Medicine

## 2018-01-09 ENCOUNTER — Other Ambulatory Visit: Payer: Self-pay | Admitting: Family Medicine

## 2018-01-15 ENCOUNTER — Other Ambulatory Visit: Payer: Self-pay | Admitting: Family Medicine

## 2018-01-22 ENCOUNTER — Other Ambulatory Visit: Payer: Self-pay | Admitting: Family Medicine

## 2018-02-04 ENCOUNTER — Other Ambulatory Visit: Payer: Self-pay | Admitting: Family Medicine

## 2018-02-08 ENCOUNTER — Other Ambulatory Visit: Payer: Self-pay | Admitting: Family Medicine

## 2018-09-01 ENCOUNTER — Ambulatory Visit (INDEPENDENT_AMBULATORY_CARE_PROVIDER_SITE_OTHER): Payer: Medicaid Other | Admitting: Otolaryngology

## 2018-10-24 ENCOUNTER — Emergency Department (HOSPITAL_COMMUNITY)
Admission: EM | Admit: 2018-10-24 | Discharge: 2018-10-24 | Disposition: A | Discharge status: Home / Self Care | Payer: Medicaid Other | Attending: Emergency Medicine | Admitting: Emergency Medicine

## 2018-10-24 ENCOUNTER — Encounter (HOSPITAL_COMMUNITY): Payer: Self-pay

## 2018-10-24 ENCOUNTER — Other Ambulatory Visit: Payer: Self-pay

## 2018-10-24 DIAGNOSIS — N454 Abscess of epididymis or testis: Secondary | ICD-10-CM | POA: Diagnosis not present

## 2018-10-24 DIAGNOSIS — Z5321 Procedure and treatment not carried out due to patient leaving prior to being seen by health care provider: Secondary | ICD-10-CM | POA: Insufficient documentation

## 2018-10-24 LAB — CBG MONITORING, ED: Glucose-Capillary: 235 mg/dL — ABNORMAL HIGH (ref 70–99)

## 2018-10-24 NOTE — ED Triage Notes (Signed)
Pt reports abscess on testicles which came up 3 days ago. Pt reports using warm compresses without relief. Pt denies fever.

## 2018-10-28 ENCOUNTER — Encounter (HOSPITAL_COMMUNITY): Payer: Self-pay | Admitting: *Deleted

## 2018-10-28 ENCOUNTER — Emergency Department (HOSPITAL_COMMUNITY): Payer: Medicaid Other

## 2018-10-28 ENCOUNTER — Other Ambulatory Visit: Payer: Self-pay

## 2018-10-28 ENCOUNTER — Emergency Department (HOSPITAL_COMMUNITY)
Admission: EM | Admit: 2018-10-28 | Discharge: 2018-10-28 | Disposition: A | Discharge status: Home / Self Care | Payer: Medicaid Other | Attending: Emergency Medicine | Admitting: Emergency Medicine

## 2018-10-28 DIAGNOSIS — F17210 Nicotine dependence, cigarettes, uncomplicated: Secondary | ICD-10-CM | POA: Insufficient documentation

## 2018-10-28 DIAGNOSIS — F209 Schizophrenia, unspecified: Secondary | ICD-10-CM | POA: Diagnosis not present

## 2018-10-28 DIAGNOSIS — I10 Essential (primary) hypertension: Secondary | ICD-10-CM | POA: Insufficient documentation

## 2018-10-28 DIAGNOSIS — Z7984 Long term (current) use of oral hypoglycemic drugs: Secondary | ICD-10-CM | POA: Insufficient documentation

## 2018-10-28 DIAGNOSIS — Z79899 Other long term (current) drug therapy: Secondary | ICD-10-CM | POA: Insufficient documentation

## 2018-10-28 DIAGNOSIS — E119 Type 2 diabetes mellitus without complications: Secondary | ICD-10-CM | POA: Diagnosis not present

## 2018-10-28 DIAGNOSIS — N492 Inflammatory disorders of scrotum: Secondary | ICD-10-CM | POA: Diagnosis not present

## 2018-10-28 DIAGNOSIS — N5089 Other specified disorders of the male genital organs: Secondary | ICD-10-CM | POA: Diagnosis present

## 2018-10-28 NOTE — ED Triage Notes (Signed)
Pt c/o boil to right testicle x 6 days ago. Pt was seen by PCP earlier this week and started antibiotics yesterday. Pt reports his PCP discussed having surgery on it next week. Pt reports pain intermittently.

## 2018-10-28 NOTE — Discharge Instructions (Addendum)
Continue the antibiotic doxycycline.  Continue warm bath soaks.  If symptoms get worse if you get fever or chills or there is marked swelling to to the right scrotal area get seen immediately over the weekend.

## 2018-10-28 NOTE — ED Provider Notes (Signed)
[facility] EMERGENCY DEPARTMENT Provider Note   CSN: [id number] Arrival date & time: 10/28/18  1501    History   Chief Complaint Chief Complaint  Patient presents with  . Groin Swelling    HPI Daniel Arias is a 43 y.o. male.     Patient with a history of some tenderness and swelling to the right scrotum.  Patient is a diabetic.  This been ongoing for about a week.  Saw his primary care doctor on Wednesday.  Was started on doxycycline for 7 days but just took the first doses yesterday so he is only had 3 doses.  Patient states that overnight the area of swelling did increase some he denies any fever or chills or feeling any significant pain in the scrotum.  No groin pain.  No difficulty urinating.  No back pain.     Past Medical History:  Diagnosis Date  . Diabetes mellitus without complication ([place])   . GERD (gastroesophageal reflux disease)   . Hypercholesteremia   . Hypertension   . Prediabetes   . Schizophrenia, paranoid ([place])   . Sleep apnea   . Sleep apnea 06/24/2016  . Substance abuse [facility])     Patient Active Problem List   Diagnosis Date Noted  . Vitamin D deficiency 09/29/2016  . Nonalcoholic steatohepatitis (NASH) 09/24/2016  . Type 2 diabetes mellitus ([place]) 07/06/2016  . Essential hypertension 06/24/2016  . HLD (hyperlipidemia) 06/24/2016  . Paranoid schizophrenia ([place]) 06/24/2016  . Sleep apnea 06/24/2016  . Tobacco abuse 06/24/2016  . Lung nodule seen on imaging study- benign 06/24/2016  . Obesity 06/24/2016    History reviewed. No pertinent surgical history.      Home Medications    Prior to Admission medications   Medication Sig Start Date End Date Taking? Authorizing Provider  ARIPiprazole (ABILIFY) 20 MG tablet Take 20 mg by mouth daily.    Yes [provider]  atorvastatin (LIPITOR) 40 MG tablet TAKE 1 TABLET (40 MG TOTAL) BY MOUTH DAILY. 11/08/17  Yes Raylene Everts, MD  hydrochlorothiazide (HYDRODIURIL) 25 MG tablet Take 1  tablet (25 mg total) by mouth daily. 06/25/17  Yes Raylene Everts, MD  losartan (COZAAR) 50 MG tablet TAKE 1 TABLET BY MOUTH EVERY DAY Patient taking differently: Take 50 mg by mouth daily.  07/22/17  Yes Raylene Everts, MD  metFORMIN (GLUCOPHAGE) 500 MG tablet Take 1,000 mg by mouth 2 (two) times daily. 10/03/18  Yes [provider]  metFORMIN (GLUCOPHAGE-XR) 500 MG 24 hr tablet TAKE 2 TABLETS BY MOUTH 2 TIMES PER DAY WITH MEALS Patient not taking: No sig reported 09/09/17   Raylene Everts, MD    Family History Family History  Problem Relation Age of Onset  . Diabetes Mother   . Mental illness Mother     Social History Social History   Tobacco Use  . Smoking status: Current Every Day Smoker    Packs/day: 0.50    Types: Cigarettes  . Smokeless tobacco: Never Used  Substance Use Topics  . Alcohol use: Yes    Comment: occ.  . Drug use: Yes    Types: Marijuana     Allergies   Patient has no known allergies.   Review of Systems Review of Systems  Constitutional: Negative for chills and fever.  HENT: Negative for rhinorrhea and sore throat.   Eyes: Negative for visual disturbance.  Respiratory: Negative for cough and shortness of breath.   Cardiovascular: Negative for chest pain and leg  swelling.  Gastrointestinal: Negative for abdominal pain, diarrhea, nausea and vomiting.  Genitourinary: Positive for scrotal swelling. Negative for dysuria, flank pain, hematuria and testicular pain.  Musculoskeletal: Negative for back pain and neck pain.  Skin: Negative for rash.  Neurological: Negative for dizziness, light-headedness and headaches.  Hematological: Does not bruise/bleed easily.  Psychiatric/Behavioral: Negative for confusion.     Physical Exam Updated Vital Signs BP (!) 144/82   Pulse 95   Temp 98.3 F (36.8 C)   Resp 18   Ht 1.88 m (6' 2" )   Wt (!) 137 kg   SpO2 95%   BMI 38.77 kg/m   Physical Exam Vitals signs and nursing note  reviewed.  Constitutional:      Appearance: He is well-developed.  HENT:     Head: Normocephalic and atraumatic.  Eyes:     Extraocular Movements: Extraocular movements intact.     Conjunctiva/sclera: Conjunctivae normal.     Pupils: Pupils are equal, round, and reactive to light.  Neck:     Musculoskeletal: Neck supple.  Cardiovascular:     Rate and Rhythm: Normal rate and regular rhythm.     Heart sounds: No murmur.  Pulmonary:     Effort: Pulmonary effort is normal. No respiratory distress.     Breath sounds: Normal breath sounds.  Abdominal:     General: Bowel sounds are normal.     Palpations: Abdomen is soft.     Tenderness: There is no abdominal tenderness.  Genitourinary:    Penis: Normal.      Scrotum/Testes: Normal.     Comments: Right scrotal wall with an area of about 4 to 5 cm of induration with a central area of some fluctuance not very tender.  No crepitance no evidence of any deep space infection.  No tenderness or swelling in the groin area.  Left side of scrotum completely normal.  Consistent with a scrotal wall abscess. Musculoskeletal: Normal range of motion.        General: No swelling.  Skin:    General: Skin is warm and dry.  Neurological:     General: No focal deficit present.     Mental Status: He is alert and oriented to person, place, and time.      ED Treatments / Results  Labs (all labs ordered are listed, but only abnormal results are displayed) Labs Reviewed - No data to display  EKG None  Radiology US Scrotum  Result Date: 10/28/2018 CLINICAL DATA:  Scrotal pain and swelling EXAM: ULTRASOUND OF SCROTUM TECHNIQUE: Complete ultrasound examination of the testicles, epididymis, and other scrotal structures was performed. COMPARISON:  None. FINDINGS: Right testicle Measurements: 4.4 x 2.2 x 3.0 cm. No mass or microlithiasis visualized. Left testicle Measurements: 4.5 x 2.4 x 2.6 cm. No mass or microlithiasis visualized. Right epididymis:   Normal in size and appearance. Left epididymis: Not clearly visualized due to edema of the overlying scrotum. Hydrocele:  Small bilateral hydroceles, right greater than left. Varicocele:  None visualized. Other: At the right inferior aspect of the scrotum, there is a complex collection that measures 4.4 x 3.3 x 2.7 cm. There is hyperemia of the surrounding soft tissue but no internal blood flow. IMPRESSION: 1. Inferior right scrotal abscess. 2. Normal testicles.  Small bilateral hydroceles. Electronically Signed   By: Ulyses Jarred M.D.   On: 10/28/2018 16:39    Procedures Procedures (including critical care time)  Medications Ordered in ED Medications - No data to display   Initial Impression /  Assessment and Plan / ED Course  I have reviewed the triage vital signs and the nursing notes.  Pertinent labs & imaging results that were available during my care of the patient were reviewed by me and considered in my medical decision making (see chart for details).        Patient with some mild right-sided scrotal swelling.  With some induration measuring about 4 to 5 cm.  Some mild central fluctuance no significant erythema not significant tenderness.  No swelling up into the groin no swelling to the left side of the scrotum.  No crepitance.  Think it is a localized scrotal wall abscess.  Patient just started antibiotics yesterday so he has had a total of 3 doses of the doxycycline.  Think patient is stable at this point in time no evidence of any complicated infection like Fournier's gangrene.  Patient is not toxic.  Thinking continue antibiotics continue tub soaks and return if it does not improve here over the weekend if he gets worse at all return immediately.  Patient understands.  Patient being followed by Dr. Anastasio Champion.  Final Clinical Impressions(s) / ED Diagnoses   Final diagnoses:  Abscess of scrotal wall    ED Discharge Orders    None       Fredia Sorrow, MD 10/28/18 2119

## 2018-10-28 NOTE — ED Notes (Signed)
Pt with boil to his testicle for 6 days  Given antibiotics by his PCP Appears that it has leaked/ruptured

## 2018-12-20 ENCOUNTER — Ambulatory Visit (INDEPENDENT_AMBULATORY_CARE_PROVIDER_SITE_OTHER): Payer: Medicaid Other | Admitting: Nurse Practitioner

## 2019-05-08 ENCOUNTER — Other Ambulatory Visit (INDEPENDENT_AMBULATORY_CARE_PROVIDER_SITE_OTHER): Payer: Self-pay | Admitting: Nurse Practitioner

## 2019-05-17 ENCOUNTER — Ambulatory Visit (INDEPENDENT_AMBULATORY_CARE_PROVIDER_SITE_OTHER): Payer: Medicaid Other | Admitting: Internal Medicine

## 2019-05-17 ENCOUNTER — Encounter (INDEPENDENT_AMBULATORY_CARE_PROVIDER_SITE_OTHER): Payer: Self-pay | Admitting: Internal Medicine

## 2019-05-17 ENCOUNTER — Other Ambulatory Visit: Payer: Self-pay

## 2019-05-17 VITALS — BP 140/70 | HR 80 | Ht 74.0 in | Wt 273.4 lb

## 2019-05-17 DIAGNOSIS — E559 Vitamin D deficiency, unspecified: Secondary | ICD-10-CM

## 2019-05-17 DIAGNOSIS — I10 Essential (primary) hypertension: Secondary | ICD-10-CM | POA: Diagnosis not present

## 2019-05-17 DIAGNOSIS — E119 Type 2 diabetes mellitus without complications: Secondary | ICD-10-CM

## 2019-05-17 DIAGNOSIS — E785 Hyperlipidemia, unspecified: Secondary | ICD-10-CM

## 2019-05-17 NOTE — Patient Instructions (Signed)
[facility] Dietary Recommendations for Weight Loss What to Avoid . Avoid added sugars o Often added sugar can be found in processed foods such as many condiments, dry cereals, cakes, cookies, chips, crisps, crackers, candies, sweetened drinks, etc.  o Read labels and AVOID/DECREASE use of foods with the following in their ingredient list: Sugar, fructose, high fructose corn syrup, sucrose, glucose, maltose, dextrose, molasses, cane sugar, brown sugar, any type of syrup, agave nectar, etc.   . Avoid snacking in between meals . Avoid foods made with flour o If you are going to eat food made with flour, choose those made with whole-grains; and, minimize your consumption as much as is tolerable . Avoid processed foods o These foods are generally stocked in the middle of the grocery store. Focus on shopping on the perimeter of the grocery.  What to Include . Vegetables o GREEN LEAFY VEGETABLES: Kale, spinach, mustard greens, collard greens, cabbage, broccoli, etc. o OTHER: Asparagus, cauliflower, eggplant, carrots, peas, Brussel sprouts, tomatoes, bell peppers, zucchini, beets, cucumbers, etc. . Grains, seeds, and legumes o Beans: kidney beans, black eyed peas, garbanzo beans, black beans, pinto beans, etc. o Whole, unrefined grains: brown rice, barley, bulgur, oatmeal, etc. . Healthy fats  o Avoid highly processed fats such as vegetable oil o Examples of healthy fats: avocado, olives, virgin olive oil, dark chocolate (?72% Cocoa), nuts (peanuts, almonds, walnuts, cashews, pecans, etc.) . Low - Moderate Intake of Animal Sources of Protein o Meat sources: chicken, turkey, salmon, tuna. Limit to 4 ounces of meat at one time. o Consider limiting dairy sources, but when choosing dairy focus on: PLAIN Greek yogurt, cottage cheese, high-protein milk . Fruit o Choose berries  When to Eat . Intermittent Fasting: o Choosing not to eat for a specific time period, but DO FOCUS ON HYDRATION  when fasting o Multiple Techniques: - Time Restricted Eating: eat 3 meals in a day, each meal lasting no more than 60 minutes, no snacks between meals - 16-18 hour fast: fast for 16 to 18 hours up to 7 days a week. Often suggested to start with 2-3 nonconsecutive days per week.  . Remember the time you sleep is counted as fasting.  . Examples of eating schedule: Fast from 7:00pm-11:00am. Eat between 11:00am-7:00pm.  - 24-hour fast: fast for 24 hours up to every other day. Often suggested to start with 1 day per week . Remember the time you sleep is counted as fasting . Examples of eating schedule:  o Eating day: eat 2-3 meals on your eating day. If doing 2 meals, each meal should last no more than 90 minutes. If doing 3 meals, each meal should last no more than 60 minutes. Finish last meal by 7:00pm. o Fasting day: Fast until 7:00pm.  o IF YOU FEEL UNWELL FOR ANY REASON/IN ANY WAY WHEN FASTING, STOP FASTING BY EATING A NUTRITIOUS SNACK OR LIGHT MEAL o ALWAYS FOCUS ON HYDRATION DURING FASTS - Acceptable Hydration sources: water, broths, tea/coffee (black tea/coffee is best but using a small amount of whole-fat dairy products in coffee/tea is acceptable).  - Poor Hydration Sources: anything with sugar or artificial sweeteners added to it  These recommendations have been developed for patients that are actively receiving medical care from either Dr. Makayla Lanter or Sarah Gray, DNP, NP-C at [facility]. These recommendations are developed for patients with specific medical conditions and are not meant to be distributed or used by others that are not actively receiving care from either provider listed   above at [facility]. It is not appropriate to participate in the above eating plans without proper medical supervision.   Reference: Fung, J. The obesity code. [place]: [place]; 2016.   

## 2019-05-17 NOTE — Progress Notes (Signed)
Wellness Office Visit  Subjective:  Patient ID: Daniel Arias, male    DOB: 1976/07/23  Age: 43 y.o. MRN: [id number]  CC: This man comes in for follow-up of diabetes, hypertension, obesity. HPI  He has been doing reasonably well and because he is eating less, he has managed to lose weight.  He continues on all his oral hypoglycemic agents for his diabetes.  He denies any polyuria polydipsia.  There is no paresthesia in his hands or feet.  He has not seen an eye doctor to check his eyes.  He continues on statin therapy for his hyperlipidemia in the face of diabetes. He continues with antihypertensive therapy.  He denies any chest pain, dyspnea or palpitations. Unfortunately continues to smoke cigarettes but he is keen to quit. Past Medical History:  Diagnosis Date  . Diabetes mellitus without complication ([place])   . GERD (gastroesophageal reflux disease)   . Hypercholesteremia   . Hypertension   . Prediabetes   . Schizophrenia, paranoid ([place])   . Sleep apnea   . Sleep apnea 06/24/2016  . Substance abuse ([place])       Family History  Problem Relation Age of Onset  . Diabetes Mother   . Mental illness Mother     Social History   Social History Narrative   Lives with friend and her son and his girlfriend and her grandson visits   [place] with lady friend for 6 years     Current Meds  Medication Sig  . ARIPiprazole (ABILIFY) 20 MG tablet Take 20 mg by mouth daily.   Marland Kitchen atorvastatin (LIPITOR) 40 MG tablet TAKE 1 TABLET (40 MG TOTAL) BY MOUTH DAILY.  Marland Kitchen empagliflozin (JARDIANCE) 10 MG TABS tablet Take 10 mg by mouth daily.  . hydrochlorothiazide (HYDRODIURIL) 25 MG tablet Take 1 tablet (25 mg total) by mouth daily.  Marland Kitchen losartan (COZAAR) 50 MG tablet TAKE 1 TABLET BY MOUTH EVERY DAY (Patient taking differently: Take 50 mg by mouth daily. )  . metFORMIN (GLUCOPHAGE) 1000 MG tablet TAKE 1 TABLET BY MOUTH TWICE A DAY WITH MEALS      Objective:   Today's  Vitals: BP 140/70   Pulse 80   Wt 273 lb 6.4 oz (124 kg)   BMI 35.10 kg/m  Vitals with BMI 05/17/2019 10/28/2018 10/28/2018  Height - - 6' 2"   Weight 273 lbs 6 oz - 302 lbs  BMI - - 67.67  Systolic 209 470 962  Diastolic 70 79 82  Pulse 80 93 95     Physical Exam   He looks systemically well.  He has lost about 20 pounds since the last time he was seen in the office which is fantastic.  He remains morbidly obese.    Assessment   1. Essential hypertension   2. Type 2 diabetes mellitus without complication, without long-term current use of insulin ([place])   3. Hyperlipidemia, unspecified hyperlipidemia type   4. Vitamin D deficiency       Tests ordered Orders Placed This Encounter  Procedures  . COMPLETE METABOLIC PANEL WITH GFR  . Hemoglobin A1c  . Lipid panel  . VITAMIN D 25 Hydroxy (Vit-D Deficiency, Fractures)  . Ambulatory referral to Ophthalmology     Plan: 1. He will continue with all medications for his chronic conditions above. 2. Blood work is ordered as above. 3. I will send him to ophthalmology for diabetic eye check. 4. Further recommendations will depend on blood results  and he will follow-up with Judson Roch in about 3 months time.     Doree Albee, MD

## 2019-05-18 ENCOUNTER — Encounter (INDEPENDENT_AMBULATORY_CARE_PROVIDER_SITE_OTHER): Payer: Self-pay | Admitting: Internal Medicine

## 2019-05-18 LAB — COMPLETE METABOLIC PANEL WITH GFR
AG Ratio: 1.6 (calc) (ref 1.0–2.5)
ALT: 16 U/L (ref 9–46)
AST: 20 U/L (ref 10–40)
Albumin: 4.9 g/dL (ref 3.6–5.1)
Alkaline phosphatase (APISO): 77 U/L (ref 36–130)
BUN: 10 mg/dL (ref 7–25)
CO2: 26 mmol/L (ref 20–32)
Calcium: 10.2 mg/dL (ref 8.6–10.3)
Chloride: 99 mmol/L (ref 98–110)
Creat: 0.95 mg/dL (ref 0.60–1.35)
GFR, Est African American: 112 mL/min/{1.73_m2} (ref >=60)
GFR, Est Non African American: 96 mL/min/{1.73_m2} (ref >=60)
Globulin: 3 g/dL (calc) (ref 1.9–3.7)
Glucose, Bld: 99 mg/dL (ref 65–99)
Potassium: 3.5 mmol/L (ref 3.5–5.3)
Sodium: 139 mmol/L (ref 135–146)
Total Bilirubin: 0.7 mg/dL (ref 0.2–1.2)
Total Protein: 7.9 g/dL (ref 6.1–8.1)

## 2019-05-18 LAB — LIPID PANEL
Cholesterol: 74 mg/dL (ref <200)
HDL: 27 mg/dL — ABNORMAL LOW (ref >=40)
LDL Cholesterol (Calc): 19 mg/dL (calc)
Non-HDL Cholesterol (Calc): 47 mg/dL (calc) (ref <130)
Total CHOL/HDL Ratio: 2.7 (calc) (ref <5.0)
Triglycerides: 227 mg/dL — ABNORMAL HIGH (ref <150)

## 2019-05-18 LAB — VITAMIN D 25 HYDROXY (VIT D DEFICIENCY, FRACTURES): Vit D, 25-Hydroxy: 39 ng/mL (ref 30–100)

## 2019-05-18 LAB — HEMOGLOBIN A1C
Hgb A1c MFr Bld: 6.3 % of total Hgb — ABNORMAL HIGH (ref <5.7)
Mean Plasma Glucose: 134 (calc)
eAG (mmol/L): 7.4 (calc)

## 2019-05-30 ENCOUNTER — Telehealth (INDEPENDENT_AMBULATORY_CARE_PROVIDER_SITE_OTHER): Payer: Self-pay

## 2019-05-30 NOTE — Telephone Encounter (Signed)
Called pt to let him know Dr Darnell Level will call him tomorrow for a phone visit

## 2019-05-30 NOTE — Telephone Encounter (Signed)
Pt called and wants to discuss his medication with you.  I have scheduled a phone visit with you tomorrow at 11am since you had time open.  Would like a call today if you have time if not tomorrow will be ok.

## 2019-05-30 NOTE — Telephone Encounter (Signed)
Okay, that would be fine.

## 2019-05-31 ENCOUNTER — Other Ambulatory Visit: Payer: Self-pay

## 2019-05-31 ENCOUNTER — Encounter (INDEPENDENT_AMBULATORY_CARE_PROVIDER_SITE_OTHER): Payer: Self-pay | Admitting: Internal Medicine

## 2019-05-31 ENCOUNTER — Ambulatory Visit (INDEPENDENT_AMBULATORY_CARE_PROVIDER_SITE_OTHER): Payer: Medicaid Other | Admitting: Internal Medicine

## 2019-05-31 DIAGNOSIS — I10 Essential (primary) hypertension: Secondary | ICD-10-CM

## 2019-05-31 DIAGNOSIS — E119 Type 2 diabetes mellitus without complications: Secondary | ICD-10-CM | POA: Diagnosis not present

## 2019-05-31 DIAGNOSIS — E785 Hyperlipidemia, unspecified: Secondary | ICD-10-CM | POA: Diagnosis not present

## 2019-05-31 NOTE — Progress Notes (Signed)
   Wellness Office Visit  Subjective:  Patient ID: Daniel Arias, male    DOB: February 18, 1976  Age: 43 y.o. MRN: [id number]  CC: This is a phone call visit with the permission of the patient who is at home and I am in my office. He had questions regarding his medications and when he should be taking them. HPI  He is currently taking all his medications at the same time around noon. Past Medical History:  Diagnosis Date  . Diabetes mellitus without complication ([place])   . GERD (gastroesophageal reflux disease)   . Hypercholesteremia   . Hypertension   . Prediabetes   . Schizophrenia, paranoid ([place])   . Sleep apnea   . Sleep apnea 06/24/2016  . Substance abuse ([place])       Family History  Problem Relation Age of Onset  . Diabetes Mother   . Mental illness Mother     Social History   Social History Narrative   Lives with friend and her son and his girlfriend and her grandson visits   [place] with lady friend for 6 years     No outpatient medications have been marked as taking for the 05/31/19 encounter (Office Visit) with Doree Albee, MD.      Objective:   Today's Vitals: There were no vitals taken for this visit. Vitals with BMI 05/17/2019 10/28/2018 10/28/2018  Height 6' 2"  - 6' 2"   Weight 273 lbs 6 oz - 302 lbs  BMI 46.28 - 63.81  Systolic 771 165 790  Diastolic 70 79 82  Pulse 80 93 95     Physical Exam   He appeared to be alert and orientated on the phone.    Assessment   1. Type 2 diabetes mellitus without complication, without long-term current use of insulin ([place])   2. Essential hypertension   3. Hyperlipidemia, unspecified hyperlipidemia type       Tests ordered No orders of the defined types were placed in this encounter.    Plan: 1. I discussed with him all his medications that he was taking and I recommended he can take all of them at noon except the second dose of metformin which she should take in the evening  together with a meal and at the same time he can take his Lipitor in the evening also.  I think this hopefully clarified matters.  As far as the mental health medication Abilify, I defer to the psychiatrist for this. 2. This phone call lasted 5 minutes and 16 seconds.     Doree Albee, MD

## 2019-06-02 ENCOUNTER — Other Ambulatory Visit (INDEPENDENT_AMBULATORY_CARE_PROVIDER_SITE_OTHER): Payer: Self-pay | Admitting: Nurse Practitioner

## 2019-07-16 ENCOUNTER — Other Ambulatory Visit (INDEPENDENT_AMBULATORY_CARE_PROVIDER_SITE_OTHER): Payer: Self-pay | Admitting: Nurse Practitioner

## 2019-07-31 ENCOUNTER — Telehealth (INDEPENDENT_AMBULATORY_CARE_PROVIDER_SITE_OTHER): Payer: Self-pay

## 2019-07-31 ENCOUNTER — Other Ambulatory Visit (INDEPENDENT_AMBULATORY_CARE_PROVIDER_SITE_OTHER): Payer: Self-pay | Admitting: Internal Medicine

## 2019-07-31 MED ORDER — LOSARTAN POTASSIUM 50 MG PO TABS: 50.0000 mg | ORAL_TABLET | Freq: Every day | ORAL | 1 refills | Status: DC

## 2019-08-01 IMAGING — US US SCROTUM
1 series · 14 of 25 positions shown · non-contrast
Comparison: None.

CLINICAL DATA: Scrotal pain and swelling

EXAM:
ULTRASOUND OF SCROTUM
TECHNIQUE: Complete ultrasound examination of the testicles, epididymis, and
other scrotal structures was performed.

[Series 1: us scrotum · 65 acquisitions, 14 frames shown]
[im 1/65]
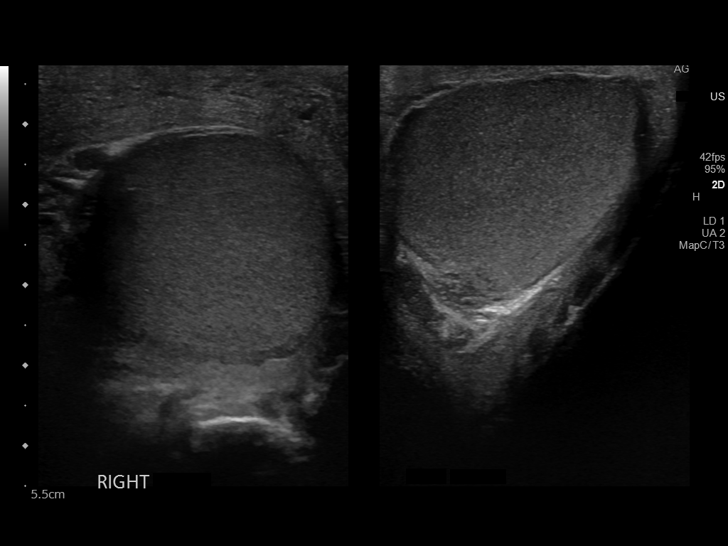
[im 6/65]
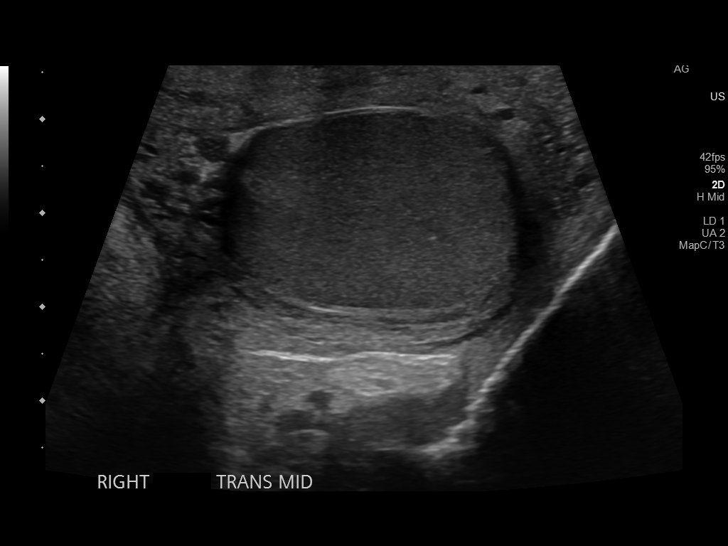
[im 11/65]
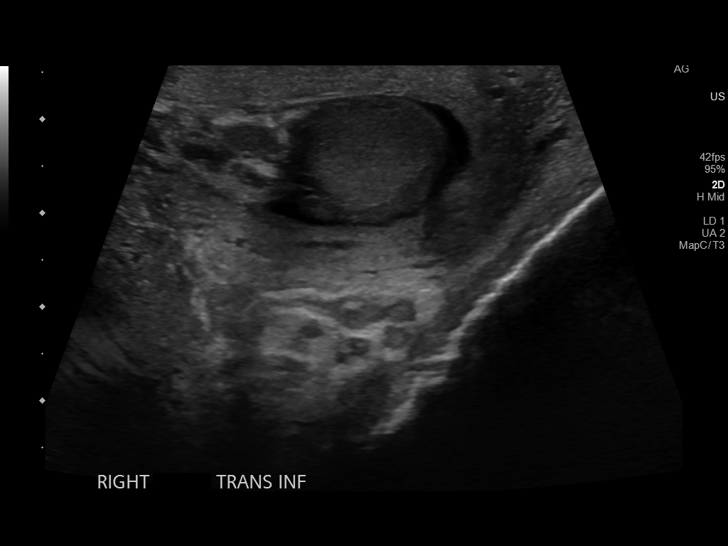
[im 17/65]
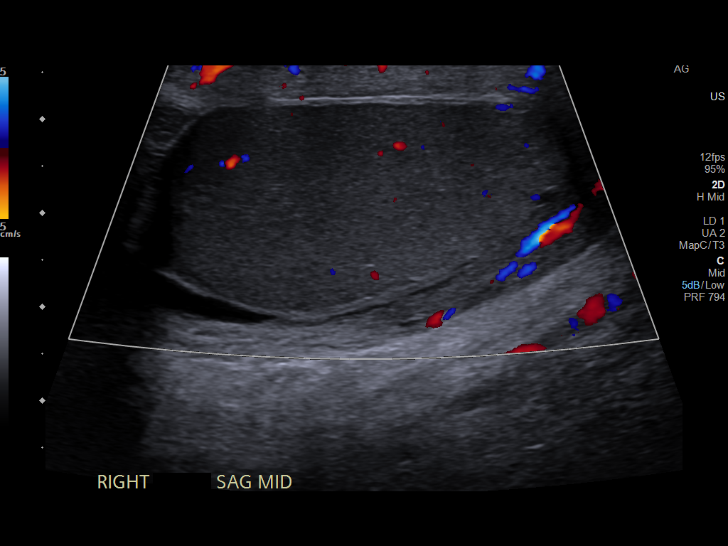
[im 22/65]
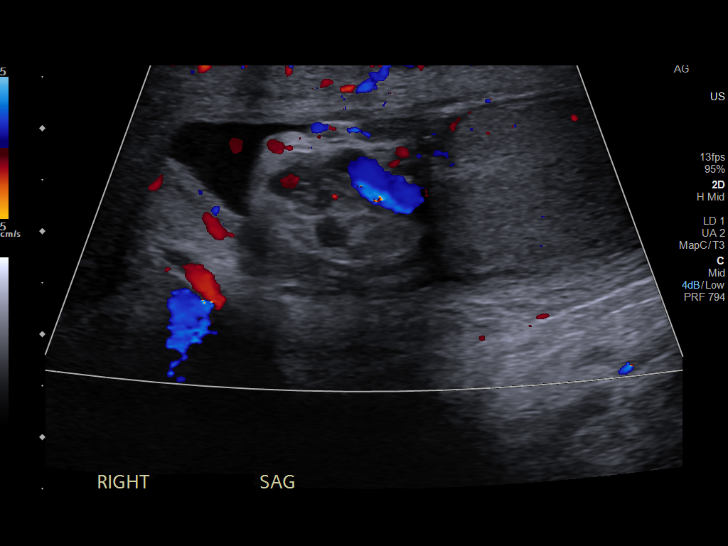
[im 25/65]
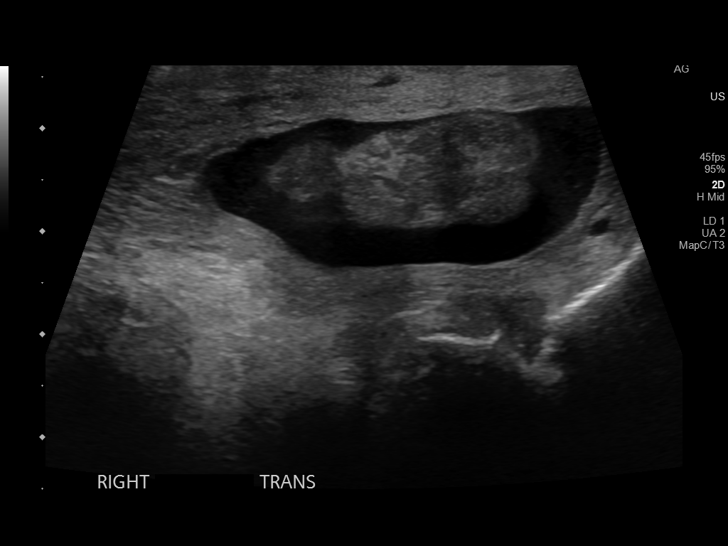
[im 30/65]
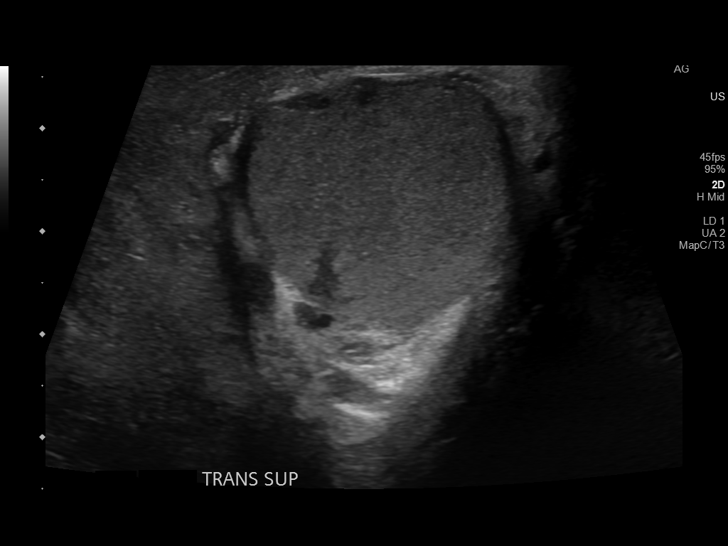
[im 35/65]
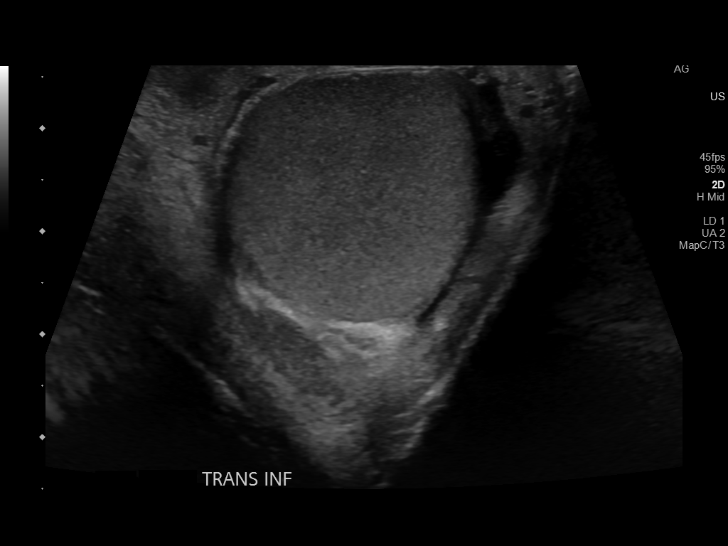
[im 41/65]
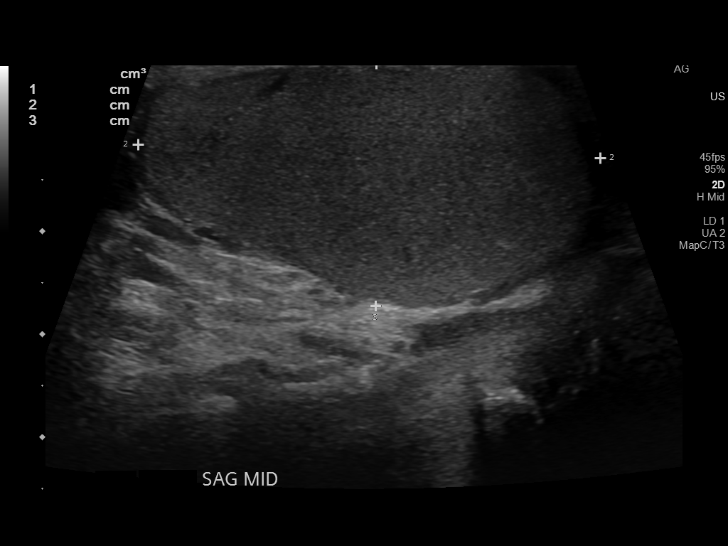
[im 43/65]
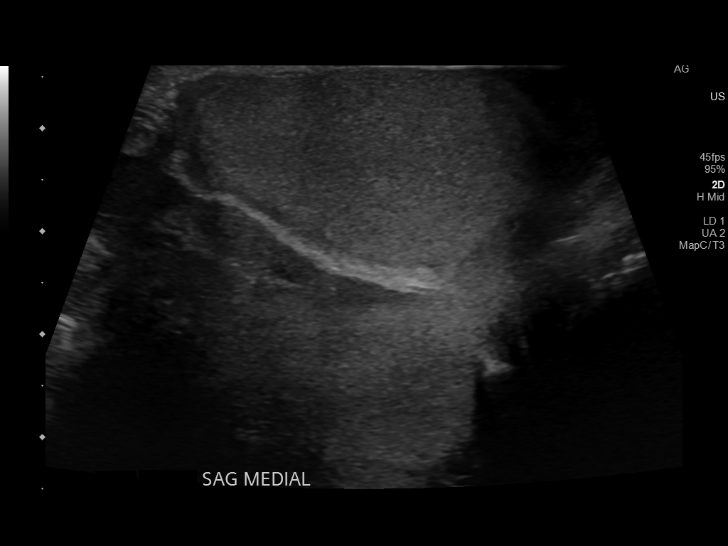
[im 49/65]
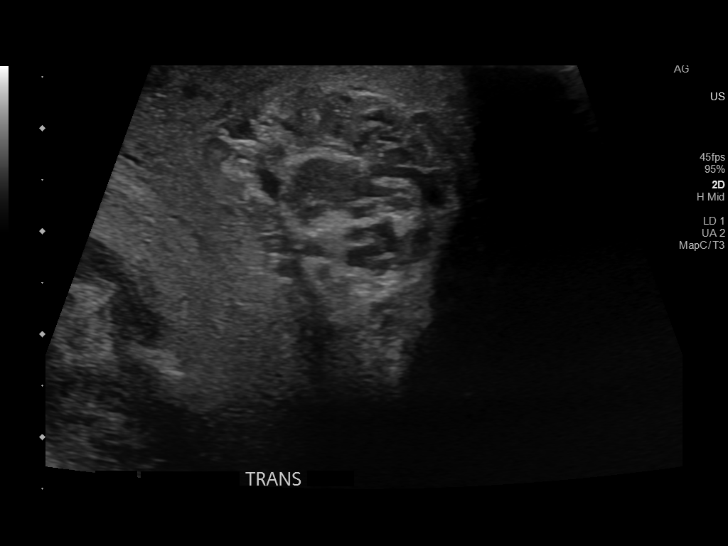
[im 54/65]
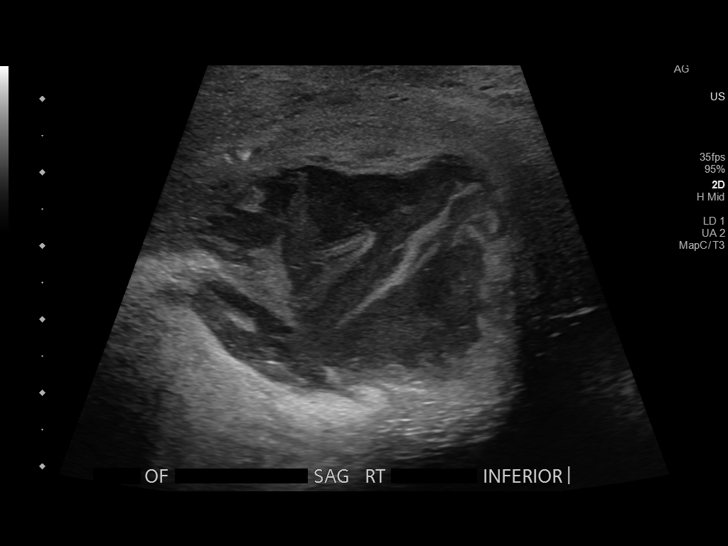
[im 59/65]
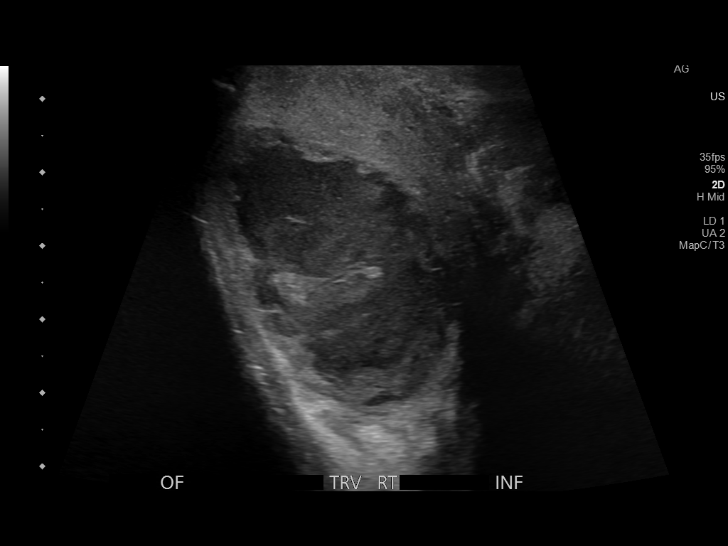
[im 65/65]
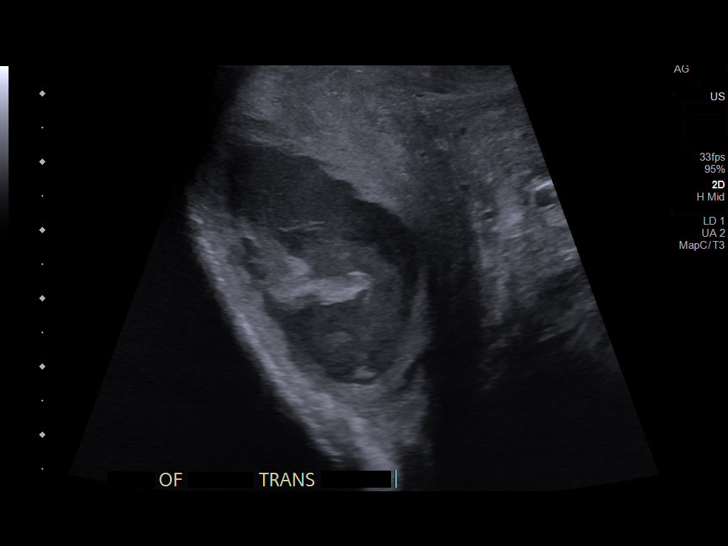

[14 of 25 positions shown; findings below may reference images not displayed]

FINDINGS: Right testicle

Measurements: 4.4 x 2.2 x 3.0 cm. No mass or microlithiasis
visualized.

Left testicle

Measurements: 4.5 x 2.4 x 2.6 cm. No mass or microlithiasis
visualized.

Right epididymis:  Normal in size and appearance.

Left epididymis: Not clearly visualized due to edema of the
overlying scrotum.

Hydrocele:  Small bilateral hydroceles, right greater than left.

Varicocele:  None visualized.

Other: At the right inferior aspect of the scrotum, there is a
complex collection that measures 4.4 x 3.3 x 2.7 cm. There is
hyperemia of the surrounding soft tissue but no internal blood flow.
IMPRESSION: 1. Inferior right scrotal abscess.
2. Normal testicles.  Small bilateral hydroceles.

## 2019-08-08 ENCOUNTER — Other Ambulatory Visit (INDEPENDENT_AMBULATORY_CARE_PROVIDER_SITE_OTHER): Payer: Self-pay | Admitting: Internal Medicine

## 2019-08-16 ENCOUNTER — Ambulatory Visit (INDEPENDENT_AMBULATORY_CARE_PROVIDER_SITE_OTHER): Payer: Medicaid Other | Admitting: Nurse Practitioner

## 2019-08-16 ENCOUNTER — Other Ambulatory Visit (INDEPENDENT_AMBULATORY_CARE_PROVIDER_SITE_OTHER): Payer: Self-pay | Admitting: Nurse Practitioner

## 2019-08-16 ENCOUNTER — Encounter (INDEPENDENT_AMBULATORY_CARE_PROVIDER_SITE_OTHER): Payer: Self-pay | Admitting: Nurse Practitioner

## 2019-08-16 ENCOUNTER — Other Ambulatory Visit: Payer: Self-pay

## 2019-08-16 VITALS — BP 136/84 | HR 95 | Temp 98.2°F | Ht 74.0 in | Wt 285.8 lb

## 2019-08-16 DIAGNOSIS — I10 Essential (primary) hypertension: Secondary | ICD-10-CM

## 2019-08-16 DIAGNOSIS — E119 Type 2 diabetes mellitus without complications: Secondary | ICD-10-CM | POA: Diagnosis not present

## 2019-08-16 DIAGNOSIS — E785 Hyperlipidemia, unspecified: Secondary | ICD-10-CM | POA: Diagnosis not present

## 2019-08-16 DIAGNOSIS — E559 Vitamin D deficiency, unspecified: Secondary | ICD-10-CM

## 2019-08-16 DIAGNOSIS — Z720 Tobacco use: Secondary | ICD-10-CM | POA: Diagnosis not present

## 2019-08-16 NOTE — Assessment & Plan Note (Signed)
We discussed tobacco cessation for longer than 5 minutes today.  He is currently in the contemplative stage, and considering quitting.  He is not ready to try nicotine patches at this time.  He is encouraged to call this office when he feels like he is ready to quit for further assistance.

## 2019-08-16 NOTE — Patient Instructions (Signed)
Thank you for choosing [facility] as your medical provider! If you have any questions or concerns regarding your health care, please do not hesitate to call our office.  Diabetes: Continue medications as prescribed.  Please continue checking her blood sugar every day.  We will refer you to eye doctor for further evaluation and annual diabetic eye exam.  If you have any hypoglycemic events (feeling shaky, cold sweats, confused, short of breath, lightheaded, etc.) check your blood sugar and if it is less than 80 drink 4 ounces of soda or juice, then wait 15 minutes and check blood sugar again to make sure it is rising.  Hypertension: Continue on current medications as prescribed  Hyperlipidemia: Continue on your atorvastatin as prescribed.  Also make sure to pick up some more omega-3 at the store.  Take 2 g of this every day.  Vitamin D deficiency: Also consider picking up some vitamin D3 (2000 IUs) tablets.  Take 1 of these daily.  Please follow-up as scheduled in 3 months. We look forward to seeing you again soon! Have a great Christmas!!  At [facility] we value your feedback. You may receive a survey about your visit today. Please share your experience as we strive to create trusting relationships with our patients to provide genuine, compassionate, quality care.  We appreciate your understanding and patience as we review any laboratory studies, imaging, and other diagnostic tests that are ordered as we care for you. We do our best to address any and all results in a timely manner. If you do not hear about test results within 1 week, please do not hesitate to contact us. If we referred you to a specialist during your visit or ordered imaging testing, contact the office if you have not been contacted to be scheduled within 1 weeks.  We also encourage the use of MyChart, which contains your medical information for your review as well. If you are not enrolled in this feature, an  access code is on this after visit summary for your convenience. Thank you for allowing [place] to be involved in your care.

## 2019-08-16 NOTE — Assessment & Plan Note (Addendum)
Patient will continue with current medication regimen as prescribed.  We will collect metabolic panel at next office visit.

## 2019-08-16 NOTE — Progress Notes (Signed)
Subjective:  Patient ID: Daniel Arias, male    DOB: 11/07/1975  Age: 43 y.o. MRN: [id number]  CC:  Chief Complaint  Patient presents with  . Follow-up      HPI  HPI  Past Medical History:  Diagnosis Date  . Diabetes mellitus without complication ([place])   . Hypercholesteremia   . Hypertension   . Prediabetes   . Schizophrenia, paranoid ([place])   . Sleep apnea   . Sleep apnea 06/24/2016  . Substance abuse ([place])       Family History  Problem Relation Age of Onset  . Diabetes Mother   . Mental illness Mother        unknown mental illness - possibly bipolar?  . Diabetes Sister     Social History   Social History Narrative   Lives with friend and her son and his girlfriend and her grandson visits   [place] with lady friend for 6 years   Social History   Tobacco Use  . Smoking status: Current Every Day Smoker    Packs/day: 0.50    Years: 22.00    Pack years: 11.00    Types: Cigarettes  . Smokeless tobacco: Never Used  Substance Use Topics  . Alcohol use: Yes    Alcohol/week: 1.0 standard drinks    Types: 1 Cans of beer per week    Comment: occ.     Current Meds  Medication Sig  . ARIPiprazole (ABILIFY) 20 MG tablet Take 20 mg by mouth daily.   Marland Kitchen atorvastatin (LIPITOR) 40 MG tablet TAKE 1 TABLET (40 MG TOTAL) BY MOUTH DAILY.  . hydrochlorothiazide (HYDRODIURIL) 25 MG tablet TAKE 1 TABLET BY MOUTH EVERY DAY  . JARDIANCE 10 MG TABS tablet TAKE 1 TABLET BY MOUTH EVERY DAY  . losartan (COZAAR) 50 MG tablet Take 1 tablet (50 mg total) by mouth daily.  . metFORMIN (GLUCOPHAGE) 500 MG tablet Take 1,000 mg by mouth 2 (two) times daily.    ROS:  Review of Systems  Constitutional: Negative for fever and malaise/fatigue.  Eyes: Negative for blurred vision.  Respiratory: Negative for cough, shortness of breath and wheezing.   Cardiovascular: Negative for chest pain and palpitations.  Gastrointestinal: Negative for abdominal pain, blood in  stool and heartburn.  Genitourinary: Positive for frequency (patient is on diuretic and SGLT2).  Neurological: Negative for headaches.  Endo/Heme/Allergies: Negative for polydipsia.  Psychiatric/Behavioral: Negative for substance abuse and suicidal ideas.     Objective:   Today's Vitals: BP 136/84   Pulse 95   Temp 98.2 F (36.8 C)   Ht 6' 2"  (1.88 m)   Wt 285 lb 12.8 oz (129.6 kg)   SpO2 98%   BMI 36.69 kg/m  Vitals with BMI 08/16/2019 05/31/2019 05/17/2019  Height 6' 2"  (No Data) 6' 2"   Weight 285 lbs 13 oz (No Data) 273 lbs 6 oz  BMI 16.60 - 63.01  Systolic 601 (No Data) 093  Diastolic 84 (No Data) 70  Pulse 95 - 80     Physical Exam Vitals reviewed.  Constitutional:      Appearance: Normal appearance.  HENT:     Head: Normocephalic and atraumatic.  Cardiovascular:     Rate and Rhythm: Normal rate and regular rhythm.     Pulses:          Dorsalis pedis pulses are 2+ on the right side.  Pulmonary:     Effort: Pulmonary effort is normal.  Breath sounds: Normal breath sounds.  Musculoskeletal:     Cervical back: Neck supple.     Right foot: No deformity or foot drop.     Left foot: No deformity or foot drop.  Feet:     Right foot:     Protective Sensation: 10 sites tested. 10 sites sensed.     Skin integrity: Skin integrity normal.     Toenail Condition: Right toenails are abnormally thick and long.     Left foot:     Protective Sensation: 10 sites tested. 8 sites sensed.     Skin integrity: Skin integrity normal.     Toenail Condition: Left toenails are abnormally thick and long.  Skin:    General: Skin is warm and dry.  Neurological:     Mental Status: He is alert and oriented to person, place, and time.  Psychiatric:        Mood and Affect: Mood normal.        Behavior: Behavior normal.        Thought Content: Thought content normal.        Judgment: Judgment normal.          Assessment   1. Type 2 diabetes mellitus without complication,  without long-term current use of insulin (Daniel Arias)   2. Essential hypertension   3. Hyperlipidemia, unspecified hyperlipidemia type   4. Tobacco abuse   5. Vitamin D deficiency       Tests ordered Orders Placed This Encounter  Procedures  . Ambulatory referral to Ophthalmology     Plan: Please see assessment and plan per problem list below.   No orders of the defined types were placed in this encounter.   Patient to follow-up in 3 months.  Ailene Ards, NP

## 2019-08-16 NOTE — Assessment & Plan Note (Signed)
He will continue on current treat regimen.  He is out of his omega-3 and I encouraged him to pick the supplement up again next time he is at the store, and remind him to take 2 g daily.

## 2019-08-16 NOTE — Assessment & Plan Note (Addendum)
Patient will continue with current medication regimen as prescribed.  He will continue checking his blood sugar daily.  We discussed the definition of hypoglycemia, how to check for, and how to treat it.  We will collect A1c at next office visit.

## 2019-08-16 NOTE — Assessment & Plan Note (Signed)
He is not currently taking a vitamin D3 supplement.  I did encourage him to collect vitamin D3 once he goes to the store and start taking about 2000 IUs daily.  He tells me he will try this.

## 2019-09-08 ENCOUNTER — Other Ambulatory Visit (INDEPENDENT_AMBULATORY_CARE_PROVIDER_SITE_OTHER): Payer: Self-pay | Admitting: Internal Medicine

## 2019-11-02 ENCOUNTER — Other Ambulatory Visit (INDEPENDENT_AMBULATORY_CARE_PROVIDER_SITE_OTHER): Payer: Medicaid Other

## 2019-11-02 ENCOUNTER — Other Ambulatory Visit: Payer: Self-pay

## 2019-11-02 ENCOUNTER — Encounter (INDEPENDENT_AMBULATORY_CARE_PROVIDER_SITE_OTHER): Payer: Self-pay

## 2019-11-03 LAB — COMPREHENSIVE METABOLIC PANEL
AG Ratio: 1.6 (calc) (ref 1.0–2.5)
ALT: 17 U/L (ref 9–46)
AST: 18 U/L (ref 10–40)
Albumin: 4.5 g/dL (ref 3.6–5.1)
Alkaline phosphatase (APISO): 67 U/L (ref 36–130)
BUN: 11 mg/dL (ref 7–25)
CO2: 25 mmol/L (ref 20–32)
Calcium: 10.1 mg/dL (ref 8.6–10.3)
Chloride: 104 mmol/L (ref 98–110)
Creat: 0.9 mg/dL (ref 0.60–1.35)
Globulin: 2.8 g/dL (calc) (ref 1.9–3.7)
Glucose, Bld: 119 mg/dL — ABNORMAL HIGH (ref 65–99)
Potassium: 4 mmol/L (ref 3.5–5.3)
Sodium: 141 mmol/L (ref 135–146)
Total Bilirubin: 0.5 mg/dL (ref 0.2–1.2)
Total Protein: 7.3 g/dL (ref 6.1–8.1)

## 2019-11-03 LAB — HEMOGLOBIN A1C
Hgb A1c MFr Bld: 7.8 % of total Hgb — ABNORMAL HIGH (ref <5.7)
Mean Plasma Glucose: 177 (calc)
eAG (mmol/L): 9.8 (calc)

## 2019-11-03 LAB — VITAMIN D 25 HYDROXY (VIT D DEFICIENCY, FRACTURES): Vit D, 25-Hydroxy: 25 ng/mL — ABNORMAL LOW (ref 30–100)

## 2019-11-06 ENCOUNTER — Other Ambulatory Visit (INDEPENDENT_AMBULATORY_CARE_PROVIDER_SITE_OTHER): Payer: Self-pay | Admitting: Internal Medicine

## 2019-11-09 ENCOUNTER — Ambulatory Visit (INDEPENDENT_AMBULATORY_CARE_PROVIDER_SITE_OTHER): Payer: Medicaid Other | Admitting: Nurse Practitioner

## 2019-11-10 ENCOUNTER — Encounter (INDEPENDENT_AMBULATORY_CARE_PROVIDER_SITE_OTHER): Payer: Self-pay | Admitting: Nurse Practitioner

## 2019-11-10 ENCOUNTER — Ambulatory Visit (INDEPENDENT_AMBULATORY_CARE_PROVIDER_SITE_OTHER): Payer: Medicaid Other | Admitting: Nurse Practitioner

## 2019-11-10 ENCOUNTER — Other Ambulatory Visit: Payer: Self-pay

## 2019-11-10 DIAGNOSIS — E559 Vitamin D deficiency, unspecified: Secondary | ICD-10-CM

## 2019-11-10 DIAGNOSIS — E119 Type 2 diabetes mellitus without complications: Secondary | ICD-10-CM

## 2019-11-10 DIAGNOSIS — I10 Essential (primary) hypertension: Secondary | ICD-10-CM

## 2019-11-10 DIAGNOSIS — E785 Hyperlipidemia, unspecified: Secondary | ICD-10-CM

## 2019-11-10 NOTE — Progress Notes (Signed)
Due to national recommendations of social distancing related to the [place] pandemic, an audio/visual tele-health visit was felt to be the most appropriate encounter type for this patient today. I connected with  [name] on 11/10/19 utilizing audio-only technology and verified that I am speaking with the correct person using two identifiers. The patient was located in their car alone, and I was located at the office of [facility] during the encounter. I discussed the limitations of evaluation and management by telemedicine. The patient expressed understanding and agreed to proceed.      Subjective:  Patient ID: Daniel Arias, male    DOB: 08-31-1975  Age: 44 y.o. MRN: [id number]  CC:  Chief Complaint  Patient presents with  . Diabetes  . Hypertension  . Hyperlipidemia  . Other    Vitamin D deficiency      HPI  This patient comes in today for the above.  Diabetes: Patient has a history of type 2 diabetes.  Last A1c was 7.8 and this was collected on 11/02/2019.  Previously it was 6.3.  He continues on Jardiance and Metformin.  Last metabolic panel in September 2020 was within normal limits.  He tried to see an eye doctor for his diabetic eye exam, but was unable to make the appointment due to not knowing how to navigate to the office.  He does not follow with the foot doctor, but does tell me that he is able to check his feet and is comfortable with clipping his own toenails.  He also continues on statin and ARB.  He does admit to eating more sweets recently.  Hypertension: He continues on hydrochlorothiazide and losartan.  Per chart review at his last office visit his blood pressure was acceptable.  He is unable to check his blood pressure at home, so he does not have a at home readings for me today.  He tells me he can get it checked at his pharmacy next time he goes.  Hyperlipidemia: He also has a history of hyperlipidemia and continues on atorvastatin 40 and 2 g of  omega-3 fish oil supplementation.  Last lipid panel collected in September is written below: Lipid Panel         Component                Value               Date/Time                 CHOL                     74                  05/17/2019 1648           TRIG                     227 (H)             05/17/2019 1648           HDL                      27 (L)              05/17/2019 1648           CHOLHDL                  2.7  05/17/2019 1648           VLDL                     NOT CALC            12/22/2016 1502           LDLCALC                  19                  05/17/2019 1648         Vitamin D deficiency: He has a history of vitamin D deficiency and continues on vitamin D3 supplement.  He tells me is taking 1000 IUs daily.  Last serum level collected on 11/02/2019 was 25.   Past Medical History:  Diagnosis Date  . Diabetes mellitus without complication ([place])   . Hypercholesteremia   . Hypertension   . Prediabetes   . Schizophrenia, paranoid ([place])   . Sleep apnea   . Sleep apnea 06/24/2016  . Substance abuse ([place])       Family History  Problem Relation Age of Onset  . Diabetes Mother   . Mental illness Mother        unknown mental illness - possibly bipolar?  . Diabetes Sister     Social History   Social History Narrative   Lives with friend and her son and his girlfriend and her grandson visits   [place] with lady friend for 6 years   Social History   Tobacco Use  . Smoking status: Current Every Day Smoker    Packs/day: 0.50    Years: 22.00    Pack years: 11.00    Types: Cigarettes  . Smokeless tobacco: Never Used  Substance Use Topics  . Alcohol use: Yes    Alcohol/week: 1.0 standard drinks    Types: 1 Cans of beer per week    Comment: occ.     No outpatient medications have been marked as taking for the 11/10/19 encounter (Office Visit) with Ailene Ards, NP.    ROS:  Review of Systems  Constitutional: Negative.     Eyes: Negative.   Respiratory: Negative.   Cardiovascular: Negative.   Neurological: Negative.      Objective:   Today's Vitals: There were no vitals taken for this visit. Vitals with BMI 11/10/2019 08/16/2019 05/31/2019  Height - 6' 2"  (No Data)  Weight - 285 lbs 13 oz (No Data)  BMI - 29.51 -  Systolic (No Data) 884 (No Data)  Diastolic (No Data) 84 (No Data)  Pulse - 95 -     Physical Exam Comprehensive physical exam not conducted today as office visit was conducted remotely.  He did sound well over the phone, and was alert and oriented.  He also appeared to have appropriate judgment.      Assessment and Plan   1. Essential hypertension   2. Type 2 diabetes mellitus without complication, without long-term current use of insulin ([place])   3. Hyperlipidemia, unspecified hyperlipidemia type   4. Vitamin D deficiency      Plan: 1.  I encouraged him to check his blood pressure next time he goes to his pharmacy.  For now he will continue on his current medications as prescribed.  2.  I encouraged him to try to focus on his diet between now and his next office visit and to avoid  processed carbohydrates.  He tells me he understands.  He will continue on his Jardiance and Metformin.  3.  He will continue on his current medication regimen as prescribed.  4.  I recommended that he increase his vitamin D3 intake from 1000 IUs daily to 2000 IUs daily.  He tells me he will do this.   Tests ordered No orders of the defined types were placed in this encounter.     No orders of the defined types were placed in this encounter.   Patient to follow-up in 3 months, or sooner as needed.  This telephone encounter lasted for 15 minutes.  Ailene Ards, NP

## 2019-12-21 ENCOUNTER — Other Ambulatory Visit (INDEPENDENT_AMBULATORY_CARE_PROVIDER_SITE_OTHER): Payer: Self-pay

## 2019-12-21 MED ORDER — ATORVASTATIN CALCIUM 40 MG PO TABS: 40.0000 mg | ORAL_TABLET | Freq: Every day | ORAL | 0 refills | Status: DC

## 2020-01-03 ENCOUNTER — Other Ambulatory Visit (INDEPENDENT_AMBULATORY_CARE_PROVIDER_SITE_OTHER): Payer: Self-pay | Admitting: Internal Medicine

## 2020-01-12 ENCOUNTER — Other Ambulatory Visit (INDEPENDENT_AMBULATORY_CARE_PROVIDER_SITE_OTHER): Payer: Self-pay | Admitting: Internal Medicine

## 2020-02-10 ENCOUNTER — Other Ambulatory Visit (INDEPENDENT_AMBULATORY_CARE_PROVIDER_SITE_OTHER): Payer: Self-pay | Admitting: Internal Medicine

## 2020-02-10 ENCOUNTER — Other Ambulatory Visit (INDEPENDENT_AMBULATORY_CARE_PROVIDER_SITE_OTHER): Payer: Self-pay | Admitting: Nurse Practitioner

## 2020-02-13 ENCOUNTER — Ambulatory Visit (INDEPENDENT_AMBULATORY_CARE_PROVIDER_SITE_OTHER): Payer: Medicaid Other | Admitting: Nurse Practitioner

## 2020-02-13 ENCOUNTER — Other Ambulatory Visit: Payer: Self-pay

## 2020-02-13 ENCOUNTER — Encounter (INDEPENDENT_AMBULATORY_CARE_PROVIDER_SITE_OTHER): Payer: Self-pay | Admitting: Nurse Practitioner

## 2020-02-13 VITALS — BP 100/60 | HR 102 | Temp 96.8°F | Ht 73.5 in | Wt 280.6 lb

## 2020-02-13 DIAGNOSIS — E782 Mixed hyperlipidemia: Secondary | ICD-10-CM

## 2020-02-13 DIAGNOSIS — I10 Essential (primary) hypertension: Secondary | ICD-10-CM | POA: Diagnosis not present

## 2020-02-13 DIAGNOSIS — E119 Type 2 diabetes mellitus without complications: Secondary | ICD-10-CM

## 2020-02-13 DIAGNOSIS — E559 Vitamin D deficiency, unspecified: Secondary | ICD-10-CM

## 2020-02-13 NOTE — Progress Notes (Signed)
Subjective:  Patient ID: Daniel Arias, male    DOB: [birth year]  Age: 44 y.o. MRN: [id number]  CC:  Chief Complaint  Patient presents with  . Hypertension  . Diabetes  . Follow-up  . Hyperlipidemia  . Other    Vitamin D deficiency      HPI  This patient comes in today for the above.  He continues on his antihypertensives and he is tolerating these well.  Last A1c was collected back in March and it was 7.8 this is up from 6.3 back in September 2020.  He continues on Jardiance and Metformin.  In March, he did mention he thought he was eating sweets more frequently, and he has been try to cut these out.  He tells me he has been trying to do this as much as possible.  He denies any hypoglycemic events and at home tells me that his blood sugars are running in the 110s.  He is on an ARB, and continues on statin therapy.  Last lipid panel was collected in September 2020 and showed LDL of 19.  He does have elevated triglycerides and takes omega-3 2 g daily, last lipid panel showed triglycerides in the mid 200s, this is improvement from mid 900s over the last few years.  Last vitamin D level was collected in March and it was below 30 at that time.  He was taking 2000 IUs of vitamin D3 daily, he tells me he has been out of this medication for a couple of days but plans on getting more once he goes to the store.   Past Medical History:  Diagnosis Date  . Diabetes mellitus without complication ([place])   . Hypercholesteremia   . Hypertension   . Prediabetes   . Schizophrenia, paranoid ([place])   . Sleep apnea   . Sleep apnea 06/24/2016  . Substance abuse ([place])       Family History  Problem Relation Age of Onset  . Diabetes Mother   . Mental illness Mother        unknown mental illness - possibly bipolar?  . Diabetes Sister     Social History   Social History Narrative   Lives with friend and her son and his girlfriend and her grandson visits   [place] with lady  friend for 6 years   Social History   Tobacco Use  . Smoking status: Current Every Day Smoker    Packs/day: 0.50    Years: 22.00    Pack years: 11.00    Types: Cigarettes  . Smokeless tobacco: Never Used  Substance Use Topics  . Alcohol use: Yes    Alcohol/week: 1.0 standard drink    Types: 1 Cans of beer per week    Comment: occ.     Current Meds  Medication Sig  . ARIPiprazole (ABILIFY) 20 MG tablet Take 20 mg by mouth daily.   Marland Kitchen atorvastatin (LIPITOR) 40 MG tablet Take 1 tablet (40 mg total) by mouth daily.  . Cholecalciferol (VITAMIN D-3) 25 MCG (1000 UT) CAPS Take 2 capsules by mouth daily.  . Cholecalciferol 1.25 MG (50000 UT) TABS Take 5,000 Int'l Units by mouth daily.  . hydrochlorothiazide (HYDRODIURIL) 25 MG tablet TAKE 1 TABLET BY MOUTH EVERY DAY  . JARDIANCE 10 MG TABS tablet TAKE 1 TABLET BY MOUTH EVERY DAY  . losartan (COZAAR) 50 MG tablet TAKE 1 TABLET BY MOUTH EVERY DAY  . metFORMIN (GLUCOPHAGE) 1000 MG  tablet TAKE 1 TABLET BY MOUTH TWICE A DAY WITH MEALS  . omega-3 acid ethyl esters (LOVAZA) 1 g capsule Take 1 capsule by mouth 2 (two) times daily.    ROS:  Review of Systems  Constitutional: Negative.   Respiratory: Negative.   Cardiovascular: Negative.   Neurological: Negative.      Objective:   Today's Vitals: BP 100/60 (BP Location: Left Arm, Patient Position: Sitting, Cuff Size: Normal)   Pulse (!) 102   Temp (!) 96.8 F (36 C) (Temporal)   Ht 6' 1.5" (1.867 m)   Wt 280 lb 9.6 oz (127.3 kg)   SpO2 96%   BMI 36.52 kg/m  Vitals with BMI 02/13/2020 11/10/2019 08/16/2019  Height 6' 1.5" - 6' 2"   Weight 280 lbs 10 oz - 285 lbs 13 oz  BMI 42.70 - 62.37  Systolic 628 (No Data) 315  Diastolic 60 (No Data) 84  Pulse 102 - 95     Physical Exam Vitals reviewed.  Constitutional:      Appearance: Normal appearance.  HENT:     Head: Normocephalic and atraumatic.  Cardiovascular:     Rate and Rhythm: Normal rate and regular rhythm.  Pulmonary:       Effort: Pulmonary effort is normal.     Breath sounds: Normal breath sounds.  Musculoskeletal:     Cervical back: Neck supple.  Skin:    General: Skin is warm and dry.  Neurological:     Mental Status: He is alert and oriented to person, place, and time.  Psychiatric:        Mood and Affect: Mood normal.        Behavior: Behavior normal.        Thought Content: Thought content normal.        Judgment: Judgment normal.          Assessment and Plan   1. Essential hypertension   2. Type 2 diabetes mellitus without complication, without long-term current use of insulin ([place])   3. Vitamin D deficiency   4. Mixed hyperlipidemia      Plan: 1.-4.  He will continue on his chronic medications as currently prescribed.  He will come back in approximately 1 month to have blood work drawn including A1c, CMP, vitamin D level, and we will check urine for any albuminuria.  He is encouraged call the office of any questions or concerns prior to his next appointment.   Tests ordered Orders Placed This Encounter  Procedures  . Hemoglobin A1c  . Vitamin D, 25-hydroxy  . Microalbumin/Creatinine Ratio, Urine  . CMP with eGFR([facility])      No orders of the defined types were placed in this encounter.   Patient to follow-up in 1 month for blood work, and 3 months for office visit.  Ailene Ards, NP

## 2020-02-13 NOTE — Patient Instructions (Signed)
Start taking vitamin D3 look for 5000IUs per tablet, take 1 tablet by mouth daily

## 2020-03-13 ENCOUNTER — Telehealth (INDEPENDENT_AMBULATORY_CARE_PROVIDER_SITE_OTHER): Payer: Self-pay

## 2020-03-13 NOTE — Telephone Encounter (Signed)
If he has not yet restarted his vitamin D3 supplement then I think we can push his lab appointment out another 6 weeks.  Encourage him to get the vitamin D3 supplement as soon as possible.  If in 6 weeks he has still not started the vitamin D3 I will still have him come in to do the other blood testing but we just won't do the vitamin D test.

## 2020-03-13 NOTE — Telephone Encounter (Signed)
Cap called stating that he hasn't had the money to get the vitamins that you recommended to him yet and he is asking should he still come do lab work tomorrow without having taken those vitamins, please advise?

## 2020-03-13 NOTE — Telephone Encounter (Signed)
Daniel Arias understands recommendation and he is rescheduled for 6 weeks for labs

## 2020-03-14 ENCOUNTER — Other Ambulatory Visit (INDEPENDENT_AMBULATORY_CARE_PROVIDER_SITE_OTHER): Payer: Medicaid Other

## 2020-04-09 ENCOUNTER — Telehealth (INDEPENDENT_AMBULATORY_CARE_PROVIDER_SITE_OTHER): Payer: Self-pay

## 2020-04-09 NOTE — Telephone Encounter (Signed)
Opened in error

## 2020-04-15 ENCOUNTER — Other Ambulatory Visit (INDEPENDENT_AMBULATORY_CARE_PROVIDER_SITE_OTHER): Payer: Self-pay | Admitting: Internal Medicine

## 2020-04-24 ENCOUNTER — Other Ambulatory Visit (INDEPENDENT_AMBULATORY_CARE_PROVIDER_SITE_OTHER): Payer: Medicaid Other

## 2020-04-24 ENCOUNTER — Other Ambulatory Visit: Payer: Self-pay

## 2020-04-25 LAB — COMPLETE METABOLIC PANEL WITH GFR
AG Ratio: 1.9 (calc) (ref 1.0–2.5)
ALT: 16 U/L (ref 9–46)
AST: 16 U/L (ref 10–40)
Albumin: 4.4 g/dL (ref 3.6–5.1)
Alkaline phosphatase (APISO): 78 U/L (ref 36–130)
BUN: 12 mg/dL (ref 7–25)
CO2: 29 mmol/L (ref 20–32)
Calcium: 9.4 mg/dL (ref 8.6–10.3)
Chloride: 101 mmol/L (ref 98–110)
Creat: 0.93 mg/dL (ref 0.60–1.35)
GFR, Est African American: 114 mL/min/{1.73_m2} (ref >=60)
GFR, Est Non African American: 98 mL/min/{1.73_m2} (ref >=60)
Globulin: 2.3 g/dL (calc) (ref 1.9–3.7)
Glucose, Bld: 194 mg/dL — ABNORMAL HIGH (ref 65–99)
Potassium: 3.8 mmol/L (ref 3.5–5.3)
Sodium: 140 mmol/L (ref 135–146)
Total Bilirubin: 0.3 mg/dL (ref 0.2–1.2)
Total Protein: 6.7 g/dL (ref 6.1–8.1)

## 2020-04-25 LAB — MICROALBUMIN / CREATININE URINE RATIO
Creatinine, Urine: 66 mg/dL (ref 20–320)
Microalb Creat Ratio: 83 mcg/mg creat — ABNORMAL HIGH (ref <30)
Microalb, Ur: 5.5 mg/dL

## 2020-04-25 LAB — HEMOGLOBIN A1C
Hgb A1c MFr Bld: 7.1 % of total Hgb — ABNORMAL HIGH (ref <5.7)
Mean Plasma Glucose: 157 (calc)
eAG (mmol/L): 8.7 (calc)

## 2020-04-25 LAB — VITAMIN D 25 HYDROXY (VIT D DEFICIENCY, FRACTURES): Vit D, 25-Hydroxy: 35 ng/mL (ref 30–100)

## 2020-05-12 ENCOUNTER — Other Ambulatory Visit (INDEPENDENT_AMBULATORY_CARE_PROVIDER_SITE_OTHER): Payer: Self-pay | Admitting: Internal Medicine

## 2020-05-12 ENCOUNTER — Other Ambulatory Visit (INDEPENDENT_AMBULATORY_CARE_PROVIDER_SITE_OTHER): Payer: Self-pay | Admitting: Nurse Practitioner

## 2020-05-15 ENCOUNTER — Ambulatory Visit (INDEPENDENT_AMBULATORY_CARE_PROVIDER_SITE_OTHER): Payer: Medicaid Other | Admitting: Internal Medicine

## 2020-05-15 ENCOUNTER — Encounter (INDEPENDENT_AMBULATORY_CARE_PROVIDER_SITE_OTHER): Payer: Self-pay | Admitting: Internal Medicine

## 2020-05-15 ENCOUNTER — Other Ambulatory Visit: Payer: Self-pay

## 2020-05-15 VITALS — BP 132/89 | HR 78 | Temp 97.3°F | Resp 18 | Ht 72.0 in | Wt 262.0 lb

## 2020-05-15 DIAGNOSIS — E559 Vitamin D deficiency, unspecified: Secondary | ICD-10-CM

## 2020-05-15 DIAGNOSIS — E119 Type 2 diabetes mellitus without complications: Secondary | ICD-10-CM | POA: Diagnosis not present

## 2020-05-15 DIAGNOSIS — I10 Essential (primary) hypertension: Secondary | ICD-10-CM | POA: Diagnosis not present

## 2020-05-15 NOTE — Progress Notes (Signed)
Metrics: Intervention Frequency ACO  Documented Smoking Status Yearly  Screened one or more times in 24 months  Cessation Counseling or  Active cessation medication Past 24 months  Past 24 months   Guideline developer: UpToDate (See UpToDate for funding source) Date Released: 2014       Wellness Office Visit  Subjective:  Patient ID: Daniel Arias, male    DOB: 09-20-1975  Age: 44 y.o. MRN: [id number]  CC: This man comes in for follow-up of diabetes, hypertension and vitamin D deficiency as well as obesity. HPI  He has been doing better with his diabetes and blood work that was taken recently a couple of weeks ago shows his hemoglobin A1c is down to 7.1% now. He is compliant with his oral hypoglycemic agents including Metformin and Jardiance. He continues with atorvastatin for his hyperlipidemia in the face of diabetes. He continues with hydrochlorothiazide for his hypertension as well as his losartan. He has been taking vitamin D3 5000 units daily for vitamin D deficiency and his level was better at 17. Past Medical History:  Diagnosis Date  . Diabetes mellitus without complication ([place])   . Hypercholesteremia   . Hypertension   . Prediabetes   . Schizophrenia, paranoid ([place])   . Sleep apnea   . Sleep apnea 06/24/2016  . Substance abuse ([place])    History reviewed. No pertinent surgical history.   Family History  Problem Relation Age of Onset  . Diabetes Mother   . Mental illness Mother        unknown mental illness - possibly bipolar?  . Diabetes Sister     Social History   Social History Narrative   Lives with friend and her son and his girlfriend and her grandson visits   [place] with lady friend for 6 years   Social History   Tobacco Use  . Smoking status: Current Every Day Smoker    Packs/day: 0.50    Years: 22.00    Pack years: 11.00    Types: Cigarettes  . Smokeless tobacco: Never Used  Substance Use Topics  . Alcohol use: Yes     Alcohol/week: 1.0 standard drink    Types: 1 Cans of beer per week    Comment: occ.    Current Meds  Medication Sig  . ARIPiprazole (ABILIFY) 20 MG tablet Take 20 mg by mouth daily.   Marland Kitchen atorvastatin (LIPITOR) 40 MG tablet TAKE 1 TABLET BY MOUTH EVERY DAY  . Cholecalciferol 1.25 MG (50000 UT) TABS Take 5,000 Int'l Units by mouth daily.  . hydrochlorothiazide (HYDRODIURIL) 25 MG tablet TAKE 1 TABLET BY MOUTH EVERY DAY  . JARDIANCE 10 MG TABS tablet TAKE 1 TABLET BY MOUTH EVERY DAY  . losartan (COZAAR) 50 MG tablet TAKE 1 TABLET BY MOUTH EVERY DAY  . metFORMIN (GLUCOPHAGE) 1000 MG tablet TAKE 1 TABLET BY MOUTH TWICE A DAY WITH MEALS  . omega-3 acid ethyl esters (LOVAZA) 1 g capsule Take 1 capsule by mouth 2 (two) times daily.  . [DISCONTINUED] Cholecalciferol (VITAMIN D-3) 25 MCG (1000 UT) CAPS Take 2 capsules by mouth daily.      Depression screen [facility] 2/9 06/25/2017 03/25/2017 12/22/2016 09/24/2016 06/24/2016  Decreased Interest 0 0 0 0 0  Down, Depressed, Hopeless 0 0 0 0 0  PHQ - 2 Score 0 0 0 0 0     Objective:   Today's Vitals: BP 132/89 (BP Location: Left Arm, Patient Position: Sitting, Cuff Size: Normal)  Pulse 78   Temp (!) 97.3 F (36.3 C) (Temporal)   Resp 18   Ht 6' (1.829 m)   Wt 262 lb (118.8 kg)   SpO2 98%   BMI 35.53 kg/m  Vitals with BMI 05/15/2020 02/13/2020 11/10/2019  Height 6' 0"  6' 1.5" -  Weight 262 lbs 280 lbs 10 oz -  BMI 94.70 96.28 -  Systolic 366 294 (No Data)  Diastolic 89 60 (No Data)  Pulse 78 102 -     Physical Exam  Although he remains obese, he has lost 18 pounds since the last time he was seen which is fantastic.     Assessment   1. Type 2 diabetes mellitus without complication, without long-term current use of insulin ([place])   2. Essential hypertension   3. Vitamin D deficiency       Tests ordered No orders of the defined types were placed in this encounter.    Plan: 1. He will continue with Jardiance and Metformin for his  diabetes and continue to work on nutrition as before. 2. He will continue with losartan and hydrochlorothiazide for his hypertension which is keeping his blood pressure under reasonable control. 3. He will increase vitamin D3 to 10,000 units daily which is what I have recommended today. 4. Follow-up with Judson Roch in about 3 months and blood work can be done again at that time.   No orders of the defined types were placed in this encounter.   Doree Albee, MD

## 2020-06-28 ENCOUNTER — Other Ambulatory Visit (INDEPENDENT_AMBULATORY_CARE_PROVIDER_SITE_OTHER): Payer: Self-pay | Admitting: Internal Medicine

## 2020-06-28 ENCOUNTER — Other Ambulatory Visit (INDEPENDENT_AMBULATORY_CARE_PROVIDER_SITE_OTHER): Payer: Self-pay | Admitting: Nurse Practitioner

## 2020-08-12 ENCOUNTER — Ambulatory Visit (INDEPENDENT_AMBULATORY_CARE_PROVIDER_SITE_OTHER): Payer: Medicaid Other | Admitting: Nurse Practitioner

## 2020-08-14 ENCOUNTER — Telehealth (INDEPENDENT_AMBULATORY_CARE_PROVIDER_SITE_OTHER): Payer: Self-pay | Admitting: Nurse Practitioner

## 2020-08-14 ENCOUNTER — Telehealth (INDEPENDENT_AMBULATORY_CARE_PROVIDER_SITE_OTHER): Payer: Medicaid Other | Admitting: Nurse Practitioner

## 2020-08-14 ENCOUNTER — Encounter (INDEPENDENT_AMBULATORY_CARE_PROVIDER_SITE_OTHER): Payer: Self-pay | Admitting: Nurse Practitioner

## 2020-08-14 DIAGNOSIS — K7581 Nonalcoholic steatohepatitis (NASH): Secondary | ICD-10-CM

## 2020-08-14 DIAGNOSIS — E119 Type 2 diabetes mellitus without complications: Secondary | ICD-10-CM

## 2020-08-14 DIAGNOSIS — E782 Mixed hyperlipidemia: Secondary | ICD-10-CM | POA: Diagnosis not present

## 2020-08-14 DIAGNOSIS — I10 Essential (primary) hypertension: Secondary | ICD-10-CM

## 2020-08-14 DIAGNOSIS — F200 Paranoid schizophrenia: Secondary | ICD-10-CM | POA: Diagnosis not present

## 2020-08-14 NOTE — Addendum Note (Signed)
Addended by: Ailene Ards on: 08/14/2020 02:53 PM   Modules accepted: Orders, Level of Service

## 2020-08-14 NOTE — Progress Notes (Signed)
I called patient this afternoon to complete his virtual visit, he did not answer the phone.  I called the secondary number in his chart and a woman answered and I asked to speak to him she told me that he was unavailable but would have him call us back.

## 2020-08-14 NOTE — Telephone Encounter (Signed)
I did order referral to psychiatry (he requests [facility] in [place]) as well as ordered an ultrasound of patient's liver today.  Please work on authorization of ultrasound and referral to psychiatry when you have a moment.  Thank you.

## 2020-08-14 NOTE — Progress Notes (Signed)
This patient was originally scheduled for his virtual visit earlier today.  Unfortunately I was unable to get a hold with him at that time.  He did call back and he was rescheduled for this afternoon so I have called him and we are now proceeding with his virtual visit.   Due to national recommendations of social distancing related to the [place] pandemic, an audio-only tele-health visit was felt to be the most appropriate encounter type for this patient today. I connected with  Daniel Arias on 08/14/20 utilizing audio-only technology and verified that I am speaking with the correct person using two identifiers. The patient was located at their friend's home, and I was located at the office of [facility] during the encounter. I discussed the limitations of evaluation and management by telemedicine. The patient expressed understanding and agreed to proceed.      Subjective:  Patient ID: Daniel Arias, male    DOB: [birth year]  Age: 44 y.o. MRN: [id number]  CC:  Chief Complaint  Patient presents with  . Hypertension  . Hyperlipidemia  . Diabetes  . nash  . Schizophrenia      HPI  This patient arrives today for the above.  Hypertension: He continues on hydrochlorothiazide and losartan for treatment of his hypertension.  Unfortunately he was not able to have his blood pressure checked in preparation for this visit.  Hyperlipidemia: He continues on Lipitor and takes omega-3 supplement for treatment of his hyperlipidemia.  He is tolerating this well.  Last LDL was 19.  He is due for lipid panel rechecked.  Diabetes: He does have a history of type 2 diabetes.  Last A1c was 7.1 from approximately 3 months ago.  He continues on Jardiance and Metformin.  He denies any hypoglycemic events.  He tells me that last time he checked his blood sugar is 139 this was following a meal.  He is on statin and ARB.  He was positive for microalbuminuria when it was checked back in September of this  year.  He endorses polyuria but believes this is a side effect to his Jardiance.  He denies any symptoms of urinary tract infection, nocturia, or rash to his groin.  NASH: He has a history of NASH.  We have discussed lifestyle and recommendations are that he avoid processed carbohydrates, sugary and fatty foods.  He is also encouraged to try to lose weight.  Schizophrenia: He does have a diagnosis of schizophrenia and continues on Abilify as part of his treatment for this.  He does follow-up with psychiatry regularly.  He is requesting referral to a different psychiatrist if possible today.  He denies any current hallucinations or suicidal ideations.  Past Medical History:  Diagnosis Date  . Diabetes mellitus without complication ([place])   . Hypercholesteremia   . Hypertension   . Prediabetes   . Schizophrenia, paranoid ([place])   . Sleep apnea   . Sleep apnea 06/24/2016  . Substance abuse ([place])       Family History  Problem Relation Age of Onset  . Diabetes Mother   . Mental illness Mother        unknown mental illness - possibly bipolar?  . Diabetes Sister     Social History   Social History Narrative   Lives with friend and her son and his girlfriend and her grandson visits   [place] with lady friend for 6 years   Social History   Tobacco  Use  . Smoking status: Current Every Day Smoker    Packs/day: 0.50    Years: 22.00    Pack years: 11.00    Types: Cigarettes  . Smokeless tobacco: Never Used  Substance Use Topics  . Alcohol use: Yes    Alcohol/week: 1.0 standard drink    Types: 1 Cans of beer per week    Comment: occ.     Current Meds  Medication Sig  . ARIPiprazole (ABILIFY) 20 MG tablet Take 20 mg by mouth daily.   Marland Kitchen atorvastatin (LIPITOR) 40 MG tablet TAKE 1 TABLET BY MOUTH EVERY DAY  . Cholecalciferol 1.25 MG (50000 UT) TABS Take 5,000 Int'l Units by mouth daily.  . hydrochlorothiazide (HYDRODIURIL) 25 MG tablet TAKE 1 TABLET BY MOUTH  EVERY DAY  . JARDIANCE 10 MG TABS tablet TAKE 1 TABLET BY MOUTH EVERY DAY  . losartan (COZAAR) 50 MG tablet TAKE 1 TABLET BY MOUTH EVERY DAY  . metFORMIN (GLUCOPHAGE) 1000 MG tablet TAKE 1 TABLET BY MOUTH TWICE A DAY WITH MEALS  . omega-3 acid ethyl esters (LOVAZA) 1 g capsule Take 1 capsule by mouth 2 (two) times daily.    ROS:  Review of Systems  Constitutional: Negative for chills and fever.  Respiratory: Negative for cough and shortness of breath.   Cardiovascular: Negative for chest pain.  Neurological: Negative for dizziness and headaches.  Psychiatric/Behavioral: Negative for hallucinations and suicidal ideas.     Objective:   Today's Vitals: There were no vitals taken for this visit. Vitals with BMI 05/15/2020 02/13/2020 11/10/2019  Height 6' 0"  6' 1.5" -  Weight 262 lbs 280 lbs 10 oz -  BMI 66.59 93.57 -  Systolic 017 793 (No Data)  Diastolic 89 60 (No Data)  Pulse 78 102 -     Physical Exam Comprehensive physical exam not conducted today as office visit was conducted over the phone.  He did sound well on the phone and answered questions appropriately he appeared to be alert and oriented and appeared to have appropriate thought processes and judgment.      Assessment and Plan   1. Paranoid schizophrenia ([place])   2. NASH (nonalcoholic steatohepatitis)   3. Type 2 diabetes mellitus without complication, without long-term current use of insulin ([place])   4. Mixed hyperlipidemia   5. Essential hypertension      Plan: 1.  Will refer [facility] for further assistance in managing his schizophrenia. 2.  We will order ultrasound of liver for surveillance of his Karlene Lineman. 3.  I would like to check his blood work today especially A1c, however he was not seen in the office so we will follow-up in about 2 weeks to have blood work drawn at that time.  For now he will continue on his current medications as prescribed. 4.  We will have blood work drawn to check  his hyperlipidemia in approximately 2 weeks.  For now he will continue on his current medications as prescribed. 5.  We will check blood pressure at next office visit in 2 weeks.  For now he continue on his medications as prescribed.  Tests ordered No orders of the defined types were placed in this encounter.     No orders of the defined types were placed in this encounter.   Patient to follow-up in 2 weeks for blood pressure check as well as to do blood work.  Total time spent on the telephone this patient was 14 minutes and 20 seconds.  Ailene Ards, NP

## 2020-08-21 NOTE — Telephone Encounter (Signed)
Referral s is sent. Working on [employer] with [employer] to find out status of coverage.

## 2020-08-22 ENCOUNTER — Ambulatory Visit (HOSPITAL_COMMUNITY): Payer: Medicaid Other

## 2020-08-26 ENCOUNTER — Telehealth (INDEPENDENT_AMBULATORY_CARE_PROVIDER_SITE_OTHER): Payer: Self-pay

## 2020-08-28 ENCOUNTER — Ambulatory Visit (INDEPENDENT_AMBULATORY_CARE_PROVIDER_SITE_OTHER): Payer: Medicaid Other | Admitting: Nurse Practitioner

## 2020-08-28 ENCOUNTER — Encounter (INDEPENDENT_AMBULATORY_CARE_PROVIDER_SITE_OTHER): Payer: Self-pay | Admitting: Nurse Practitioner

## 2020-08-28 ENCOUNTER — Other Ambulatory Visit: Payer: Self-pay

## 2020-08-28 VITALS — BP 134/86 | HR 111 | Temp 98.1°F | Ht 72.0 in | Wt 289.2 lb

## 2020-08-28 DIAGNOSIS — I10 Essential (primary) hypertension: Secondary | ICD-10-CM

## 2020-08-28 DIAGNOSIS — E119 Type 2 diabetes mellitus without complications: Secondary | ICD-10-CM

## 2020-08-28 DIAGNOSIS — E559 Vitamin D deficiency, unspecified: Secondary | ICD-10-CM | POA: Diagnosis not present

## 2020-08-28 DIAGNOSIS — E782 Mixed hyperlipidemia: Secondary | ICD-10-CM

## 2020-08-28 DIAGNOSIS — G4730 Sleep apnea, unspecified: Secondary | ICD-10-CM

## 2020-08-28 DIAGNOSIS — M79602 Pain in left arm: Secondary | ICD-10-CM | POA: Diagnosis not present

## 2020-08-28 DIAGNOSIS — K7581 Nonalcoholic steatohepatitis (NASH): Secondary | ICD-10-CM

## 2020-08-28 NOTE — Progress Notes (Signed)
Subjective:  Patient ID: Daniel Arias, male    DOB: 04-21-1976  Age: 45 y.o. MRN: [id number]  CC:  Chief Complaint  Patient presents with  . Follow-up    Night sweats and sharp left chest pain that radiates into left bicep area of arm, worse at night when he sleeps and started about a month ago, and comes and goes  . Hypertension  . Other    NASH, Vitamin D deficiency, left arm pain  . Hyperlipidemia  . Diabetes      HPI  This patient arrives today for the above.  Hypertension: He continues on his antihypertensives and is tolerating his medications well.  [place]: We have talked in the past about diet and lifestyle and recommendation to avoid sugary and fatty foods, as well as recommendation to try and lose weight.  He is scheduled for ultrasound in a couple days for surveillance of his liver.  Vitamin D deficiency: He has been taking his vitamin D3 supplement, but tells me he ran out about 3 days ago.  He plans on getting more shortly.  Left arm pain: Reports some left arm and shoulder pain that has been occurring intermittently for the last month or so.  He tells me it only occurs at night when he lays down to wear his CPAP machine.  He denies any chest pain or shortness of breath.  He does report night sweats.  Rates pain at a 6/10.  He tells me will spontaneously resolve.  He is not sure if position changes affect the pain.  Hyperlipidemia: He continues on his statin and omega-3 medications for his cholesterol.  Diabetes: Last A1c was 7.1.  He continues on Jardiance and Metformin.  Tells me he has an eye doctor appointment coming up later this month.  He is on statin and ARB.  He was positive for microalbuminuria.  Past Medical History:  Diagnosis Date  . Diabetes mellitus without complication ([place])   . Hypercholesteremia   . Hypertension   . Prediabetes   . Schizophrenia, paranoid ([place])   . Sleep apnea   . Sleep apnea 06/24/2016  . Substance abuse ([place])        Family History  Problem Relation Age of Onset  . Diabetes Mother   . Mental illness Mother        unknown mental illness - possibly bipolar?  . Diabetes Sister     Social History   Social History Narrative   Lives with friend and her son and his girlfriend and her grandson visits   [place] with lady friend for 6 years   Social History   Tobacco Use  . Smoking status: Current Every Day Smoker    Packs/day: 0.50    Years: 22.00    Pack years: 11.00    Types: Cigarettes  . Smokeless tobacco: Never Used  Substance Use Topics  . Alcohol use: Yes    Alcohol/week: 1.0 standard drink    Types: 1 Cans of beer per week    Comment: occ.     Current Meds  Medication Sig  . ARIPiprazole (ABILIFY) 20 MG tablet Take 20 mg by mouth daily.   Marland Kitchen atorvastatin (LIPITOR) 40 MG tablet TAKE 1 TABLET BY MOUTH EVERY DAY  . Cholecalciferol 1.25 MG (50000 UT) TABS Take 5,000 Int'l Units by mouth daily.  . hydrochlorothiazide (HYDRODIURIL) 25 MG tablet TAKE 1 TABLET BY MOUTH EVERY DAY  . JARDIANCE 10 MG  TABS tablet TAKE 1 TABLET BY MOUTH EVERY DAY  . losartan (COZAAR) 50 MG tablet TAKE 1 TABLET BY MOUTH EVERY DAY  . metFORMIN (GLUCOPHAGE) 1000 MG tablet TAKE 1 TABLET BY MOUTH TWICE A DAY WITH MEALS    ROS:  Review of Systems  Constitutional: Positive for diaphoresis. Negative for fever.  Respiratory: Negative for shortness of breath.   Cardiovascular: Negative for chest pain.  Gastrointestinal: Negative for abdominal pain, nausea and vomiting.  Neurological: Negative for dizziness and headaches.     Objective:   Today's Vitals: BP 134/86   Pulse (!) 111   Temp 98.1 F (36.7 C) (Temporal)   Ht 6' (1.829 m)   Wt 289 lb 3.2 oz (131.2 kg)   SpO2 95%   BMI 39.22 kg/m  Vitals with BMI 08/28/2020 05/15/2020 02/13/2020  Height 6' 0"  6' 0"  6' 1.5"  Weight 289 lbs 3 oz 262 lbs 280 lbs 10 oz  BMI 39.21 40.98 11.91  Systolic 478 295 621  Diastolic 86 89 60   Pulse 111 78 102     Physical Exam Vitals reviewed.  Constitutional:      Appearance: Normal appearance.  HENT:     Head: Normocephalic and atraumatic.  Cardiovascular:     Rate and Rhythm: Normal rate and regular rhythm.  Pulmonary:     Effort: Pulmonary effort is normal.     Breath sounds: Normal breath sounds.  Musculoskeletal:     Cervical back: Neck supple.  Skin:    General: Skin is warm and dry.  Neurological:     Mental Status: He is alert and oriented to person, place, and time.  Psychiatric:        Mood and Affect: Mood normal.        Behavior: Behavior normal.        Thought Content: Thought content normal.        Judgment: Judgment normal.          Assessment and Plan   1. Type 2 diabetes mellitus without complication, without long-term current use of insulin ([place])   2. Left arm pain   3. Vitamin D deficiency   4. Essential hypertension   5. NASH (nonalcoholic steatohepatitis)   6. Mixed hyperlipidemia   7. Sleep apnea, unspecified type      Plan: 1.  We will collect A1c today as well as recheck urine for albuminuria.  He will continue taking his medications as prescribed. 2. Etiology unclear, no significant worrisome findings noted on exam or EKG. Possibly musculoskeletal in nature. Offered to trial him on muscle relaxer but he declined.  For now he was encouraged to continue monitoring his symptoms and consider discussing this with his neurologist next time he goes for follow-up for obstructive sleep apnea since using CPAP seems to be a known trigger for the pain.  He was told that if his symptoms progress and he experiences worse pain, chest pain, difficulty breathing, etc. that he is to notify us.  He tells me he understands. 3.  He will continue on his vitamin D3 supplement we will check serum level today. 4.  He will continue on his antihypertensives as prescribed. 5.  He will follow-up to have ultrasound completed within the next couple of days  as scheduled. 6.  We will check lipid panel for further evaluation today. 7.  We will follow up with his neurologist for evaluation management of his sleep apnea as scheduled.   Tests ordered Orders Placed This Encounter  Procedures  . Hemoglobin A1c  . CMP with eGFR([facility])  . Microalbumin/Creatinine Ratio, Urine  . Lipid Panel  . Vitamin D, 25-hydroxy  . EKG 12-Lead      No orders of the defined types were placed in this encounter.   Patient to follow-up in 3 months or sooner as needed  Ailene Ards, NP

## 2020-08-29 ENCOUNTER — Other Ambulatory Visit (INDEPENDENT_AMBULATORY_CARE_PROVIDER_SITE_OTHER): Payer: Self-pay | Admitting: Nurse Practitioner

## 2020-08-29 NOTE — Progress Notes (Signed)
Orders for lab work to investigate hypercalcemia.

## 2020-08-30 ENCOUNTER — Ambulatory Visit (HOSPITAL_COMMUNITY)
Admission: RE | Admit: 2020-08-30 | Discharge: 2020-08-30 | Disposition: A | Discharge status: Home / Self Care | Payer: Medicaid Other | Source: Ambulatory Visit | Attending: Nurse Practitioner | Admitting: Nurse Practitioner

## 2020-08-30 ENCOUNTER — Other Ambulatory Visit: Payer: Self-pay

## 2020-08-30 DIAGNOSIS — K7581 Nonalcoholic steatohepatitis (NASH): Secondary | ICD-10-CM | POA: Diagnosis not present

## 2020-08-30 LAB — CBC WITH DIFFERENTIAL/PLATELET
Absolute Monocytes: 930 cells/uL (ref 200–950)
Basophils Absolute: 43 cells/uL (ref 0–200)
Basophils Relative: 0.3 %
Eosinophils Absolute: 200 cells/uL (ref 15–500)
Eosinophils Relative: 1.4 %
HCT: 49.9 % (ref 38.5–50.0)
Hemoglobin: 15.4 g/dL (ref 13.2–17.1)
Lymphs Abs: 3089 cells/uL (ref 850–3900)
MCH: 26.1 pg — ABNORMAL LOW (ref 27.0–33.0)
MCHC: 30.9 g/dL — ABNORMAL LOW (ref 32.0–36.0)
MCV: 84.4 fL (ref 80.0–100.0)
MPV: 11.4 fL (ref 7.5–12.5)
Monocytes Relative: 6.5 %
Neutro Abs: 10039 cells/uL — ABNORMAL HIGH (ref 1500–7800)
Neutrophils Relative %: 70.2 %
Platelets: 377 10*3/uL (ref 140–400)
RBC: 5.91 10*6/uL — ABNORMAL HIGH (ref 4.20–5.80)
RDW: 14.8 % (ref 11.0–15.0)
Total Lymphocyte: 21.6 %
WBC: 14.3 10*3/uL — ABNORMAL HIGH (ref 3.8–10.8)

## 2020-08-30 LAB — MICROALBUMIN / CREATININE URINE RATIO
Creatinine, Urine: 49 mg/dL (ref 20–320)
Microalb Creat Ratio: 98 mcg/mg creat — ABNORMAL HIGH (ref <30)
Microalb, Ur: 4.8 mg/dL

## 2020-08-30 LAB — COMPLETE METABOLIC PANEL WITH GFR
AG Ratio: 1.7 (calc) (ref 1.0–2.5)
ALT: 18 U/L (ref 9–46)
AST: 14 U/L (ref 10–40)
Albumin: 4.8 g/dL (ref 3.6–5.1)
Alkaline phosphatase (APISO): 77 U/L (ref 36–130)
BUN: 13 mg/dL (ref 7–25)
CO2: 28 mmol/L (ref 20–32)
Calcium: 10.4 mg/dL — ABNORMAL HIGH (ref 8.6–10.3)
Chloride: 102 mmol/L (ref 98–110)
Creat: 1.05 mg/dL (ref 0.60–1.35)
GFR, Est African American: 98 mL/min/{1.73_m2} (ref >=60)
GFR, Est Non African American: 85 mL/min/{1.73_m2} (ref >=60)
Globulin: 2.9 g/dL (calc) (ref 1.9–3.7)
Glucose, Bld: 140 mg/dL — ABNORMAL HIGH (ref 65–139)
Potassium: 4.4 mmol/L (ref 3.5–5.3)
Sodium: 141 mmol/L (ref 135–146)
Total Bilirubin: 0.4 mg/dL (ref 0.2–1.2)
Total Protein: 7.7 g/dL (ref 6.1–8.1)

## 2020-08-30 LAB — LIPID PANEL
Cholesterol: 121 mg/dL (ref <200)
HDL: 33 mg/dL — ABNORMAL LOW (ref >=40)
Non-HDL Cholesterol (Calc): 88 mg/dL (calc) (ref <130)
Total CHOL/HDL Ratio: 3.7 (calc) (ref <5.0)
Triglycerides: 675 mg/dL — ABNORMAL HIGH (ref <150)

## 2020-08-30 LAB — HEMOGLOBIN A1C
Hgb A1c MFr Bld: 7.4 % of total Hgb — ABNORMAL HIGH (ref <5.7)
Mean Plasma Glucose: 166 mg/dL
eAG (mmol/L): 9.2 mmol/L

## 2020-08-30 LAB — VITAMIN D 25 HYDROXY (VIT D DEFICIENCY, FRACTURES): Vit D, 25-Hydroxy: 22 ng/mL — ABNORMAL LOW (ref 30–100)

## 2020-09-01 ENCOUNTER — Other Ambulatory Visit (INDEPENDENT_AMBULATORY_CARE_PROVIDER_SITE_OTHER): Payer: Self-pay | Admitting: Nurse Practitioner

## 2020-09-04 ENCOUNTER — Telehealth (INDEPENDENT_AMBULATORY_CARE_PROVIDER_SITE_OTHER): Payer: Self-pay | Admitting: Nurse Practitioner

## 2020-09-04 DIAGNOSIS — D72829 Elevated white blood cell count, unspecified: Secondary | ICD-10-CM

## 2020-09-04 NOTE — Telephone Encounter (Signed)
I too tried to call this patient today to see if he is having any clinical symptoms of infection. He did not answer his cell and as you have stated previously no voicemail can be left on that number. I tried his home number and a male answered. She told me that he was not available at the time of my call. I asked her to have him call Dr. Lanice Shirts office once he returns. She tells me she will ask that he give [place] a call. If he calls back please let me know what he says. If you leave for the day prior to him calling back, please let Nellie know what information we need so that she is aware in case he calls back while she is there.  Also, he has a lab appointment scheduled for 10/02/20 for investigation of elevated calcium, I am going to add additional labs for investigation of his WBCs. If you speak to him you can let him know this as well. Thank you.

## 2020-09-04 NOTE — Telephone Encounter (Signed)
Sharing with you just in case patient calls. Please see message and also see message in Lab Results. Thank you!

## 2020-09-13 ENCOUNTER — Other Ambulatory Visit (INDEPENDENT_AMBULATORY_CARE_PROVIDER_SITE_OTHER): Payer: Self-pay | Admitting: Nurse Practitioner

## 2020-10-02 ENCOUNTER — Other Ambulatory Visit (INDEPENDENT_AMBULATORY_CARE_PROVIDER_SITE_OTHER): Payer: Medicaid Other

## 2020-10-10 ENCOUNTER — Other Ambulatory Visit: Payer: Self-pay

## 2020-10-10 ENCOUNTER — Other Ambulatory Visit (INDEPENDENT_AMBULATORY_CARE_PROVIDER_SITE_OTHER): Payer: Medicaid Other

## 2020-10-11 LAB — CBC WITH DIFFERENTIAL/PLATELET
Absolute Monocytes: 663 cells/uL (ref 200–950)
Basophils Absolute: 31 cells/uL (ref 0–200)
Basophils Relative: 0.3 %
Eosinophils Absolute: 265 cells/uL (ref 15–500)
Eosinophils Relative: 2.6 %
HCT: 46.1 % (ref 38.5–50.0)
Hemoglobin: 15.2 g/dL (ref 13.2–17.1)
Lymphs Abs: 2805 cells/uL (ref 850–3900)
MCH: 27 pg (ref 27.0–33.0)
MCHC: 33 g/dL (ref 32.0–36.0)
MCV: 81.7 fL (ref 80.0–100.0)
MPV: 9.6 fL (ref 7.5–12.5)
Monocytes Relative: 6.5 %
Neutro Abs: 6436 cells/uL (ref 1500–7800)
Neutrophils Relative %: 63.1 %
Platelets: 313 10*3/uL (ref 140–400)
RBC: 5.64 10*6/uL (ref 4.20–5.80)
RDW: 14.4 % (ref 11.0–15.0)
Total Lymphocyte: 27.5 %
WBC: 10.2 10*3/uL (ref 3.8–10.8)

## 2020-10-11 LAB — PTH, INTACT AND CALCIUM
Calcium: 9.7 mg/dL (ref 8.6–10.3)
PTH: 38 pg/mL (ref 14–64)

## 2020-10-11 LAB — MICROALBUMIN / CREATININE URINE RATIO
Creatinine, Urine: 111 mg/dL (ref 20–320)
Microalb Creat Ratio: 111 mcg/mg creat — ABNORMAL HIGH (ref <30)
Microalb, Ur: 12.3 mg/dL

## 2020-10-11 LAB — PATHOLOGIST SMEAR REVIEW

## 2020-10-14 ENCOUNTER — Encounter (INDEPENDENT_AMBULATORY_CARE_PROVIDER_SITE_OTHER): Payer: Self-pay | Admitting: Nurse Practitioner

## 2020-10-14 ENCOUNTER — Telehealth (INDEPENDENT_AMBULATORY_CARE_PROVIDER_SITE_OTHER): Payer: Self-pay | Admitting: Nurse Practitioner

## 2020-10-14 NOTE — Telephone Encounter (Signed)
Please print letter dated 10/14/20 authored by myself and mail to patient. Thank you.

## 2020-10-14 NOTE — Telephone Encounter (Signed)
done

## 2020-11-01 LAB — HM DIABETES EYE EXAM

## 2020-11-18 ENCOUNTER — Other Ambulatory Visit (INDEPENDENT_AMBULATORY_CARE_PROVIDER_SITE_OTHER): Payer: Self-pay | Admitting: Nurse Practitioner

## 2020-11-28 ENCOUNTER — Other Ambulatory Visit (INDEPENDENT_AMBULATORY_CARE_PROVIDER_SITE_OTHER): Payer: Self-pay | Admitting: Internal Medicine

## 2020-11-28 ENCOUNTER — Other Ambulatory Visit (INDEPENDENT_AMBULATORY_CARE_PROVIDER_SITE_OTHER): Payer: Self-pay | Admitting: Nurse Practitioner

## 2020-12-18 ENCOUNTER — Other Ambulatory Visit (HOSPITAL_BASED_OUTPATIENT_CLINIC_OR_DEPARTMENT_OTHER): Payer: Self-pay

## 2020-12-18 DIAGNOSIS — G4733 Obstructive sleep apnea (adult) (pediatric): Secondary | ICD-10-CM

## 2020-12-26 ENCOUNTER — Encounter (INDEPENDENT_AMBULATORY_CARE_PROVIDER_SITE_OTHER): Payer: Self-pay | Admitting: Internal Medicine

## 2020-12-26 ENCOUNTER — Ambulatory Visit (INDEPENDENT_AMBULATORY_CARE_PROVIDER_SITE_OTHER): Payer: Medicaid Other | Admitting: Internal Medicine

## 2020-12-26 ENCOUNTER — Other Ambulatory Visit: Payer: Self-pay

## 2020-12-26 VITALS — BP 150/78 | HR 113 | Temp 97.6°F | Resp 18 | Ht 72.0 in | Wt 285.0 lb

## 2020-12-26 DIAGNOSIS — E782 Mixed hyperlipidemia: Secondary | ICD-10-CM | POA: Diagnosis not present

## 2020-12-26 DIAGNOSIS — G4730 Sleep apnea, unspecified: Secondary | ICD-10-CM

## 2020-12-26 DIAGNOSIS — E119 Type 2 diabetes mellitus without complications: Secondary | ICD-10-CM | POA: Diagnosis not present

## 2020-12-26 DIAGNOSIS — I10 Essential (primary) hypertension: Secondary | ICD-10-CM | POA: Diagnosis not present

## 2020-12-26 DIAGNOSIS — K7581 Nonalcoholic steatohepatitis (NASH): Secondary | ICD-10-CM

## 2020-12-26 MED ORDER — LOSARTAN POTASSIUM 100 MG PO TABS: 100.0000 mg | ORAL_TABLET | Freq: Every day | ORAL | 1 refills | Status: AC

## 2020-12-26 NOTE — Progress Notes (Signed)
Metrics: Intervention Frequency ACO  Documented Smoking Status Yearly  Screened one or more times in 24 months  Cessation Counseling or  Active cessation medication Past 24 months  Past 24 months   Guideline developer: UpToDate (See UpToDate for funding source) Date Released: 2014       Wellness Office Visit  Subjective:  Patient ID: Daniel Arias, male    DOB: 08-30-1975  Age: 45 y.o. MRN: [id number]  CC: This man comes in for follow-up of diabetes, hypertension, hyperlipidemia, fatty liver disease and sleep apnea. HPI He says he is due to have a another sleep study done soon at [facility]. He continues with antihypertensive medications listed below. His last hemoglobin A1c was 7.4%. He continues on Jardiance and metformin for his diabetes.  Past Medical History:  Diagnosis Date  . Diabetes mellitus without complication ([place])   . Hypercholesteremia   . Hypertension   . Prediabetes   . Schizophrenia, paranoid ([place])   . Sleep apnea   . Sleep apnea 06/24/2016  . Substance abuse ([place])    History reviewed. No pertinent surgical history.   Family History  Problem Relation Age of Onset  . Diabetes Mother   . Mental illness Mother        unknown mental illness - possibly bipolar?  . Diabetes Sister     Social History   Social History Narrative   Lives with friend and her son and his girlfriend and her grandson visits   [place] with lady friend for 6 years   Social History   Tobacco Use  . Smoking status: Current Every Day Smoker    Packs/day: 0.50    Years: 22.00    Pack years: 11.00    Types: Cigarettes  . Smokeless tobacco: Never Used  Substance Use Topics  . Alcohol use: Yes    Alcohol/week: 1.0 standard drink    Types: 1 Cans of beer per week    Comment: occ.    Current Meds  Medication Sig  . ARIPiprazole (ABILIFY) 20 MG tablet Take 20 mg by mouth daily.   Marland Kitchen atorvastatin (LIPITOR) 40 MG tablet TAKE 1 TABLET BY MOUTH EVERY  DAY  . Cholecalciferol 1.25 MG (50000 UT) TABS Take 5,000 Int'l Units by mouth daily.  . hydrochlorothiazide (HYDRODIURIL) 25 MG tablet TAKE 1 TABLET BY MOUTH EVERY DAY  . JARDIANCE 10 MG TABS tablet TAKE 1 TABLET BY MOUTH EVERY DAY  . losartan (COZAAR) 100 MG tablet Take 1 tablet (100 mg total) by mouth daily.  . metFORMIN (GLUCOPHAGE) 1000 MG tablet TAKE 1 TABLET BY MOUTH TWICE A DAY WITH MEALS  . omega-3 acid ethyl esters (LOVAZA) 1 g capsule Take 1 capsule by mouth 2 (two) times daily.  . [DISCONTINUED] losartan (COZAAR) 50 MG tablet TAKE 1 TABLET BY MOUTH [place] Office Visit from 12/26/2020 in [facility]  PHQ-9 Total Score 9      Objective:   Today's Vitals: BP (!) 150/78 (BP Location: Right Arm, Patient Position: Sitting, Cuff Size: Normal)   Pulse (!) 113   Temp 97.6 F (36.4 C) (Temporal)   Resp 18   Ht 6' (1.829 m)   Wt 285 lb (129.3 kg)   SpO2 95%   BMI 38.65 kg/m  Vitals with BMI 12/26/2020 08/28/2020 05/15/2020  Height 6' 0"  6' 0"  6' 0"   Weight 285 lbs 289 lbs 3 oz 262 lbs  BMI 38.64 39.21  90.12  Systolic 224 114 643  Diastolic 78 86 89  Pulse 142 111 78     Physical Exam  He remains morbidly obese but has lost about 4 pounds since last visit.  Blood pressure uncontrolled.  Alert and orientated without any focal logical signs.     Assessment   1. Type 2 diabetes mellitus without complication, without long-term current use of insulin ([place])   2. Essential hypertension   3. NASH (nonalcoholic steatohepatitis)   4. Mixed hyperlipidemia   5. Sleep apnea, unspecified type       Tests ordered Orders Placed This Encounter  Procedures  . COMPLETE METABOLIC PANEL WITH GFR  . Lipid panel  . Hemoglobin A1c     Plan: 1. Continue with oral hypoglycemic agents and we will check A1c for his diabetes. 2. His blood pressure is not well controlled and I am going to increase his losartan 100 mg daily.  He will take 2 tablets of  losartan 50 mg tablets daily and then I have sent a new prescription for losartan 100 mg tablets, take 1 daily. 3. Continue with Lovaza and statin therapy for his dyslipidemia.  Check lipid panel. 4. Follow-up with Judson Roch in about 4 months.   Meds ordered this encounter  Medications  . losartan (COZAAR) 100 MG tablet    Sig: Take 1 tablet (100 mg total) by mouth daily.    Dispense:  90 tablet    Refill:  1    Enyla [place] Luther Parody, MD

## 2020-12-27 LAB — COMPLETE METABOLIC PANEL WITH GFR
AG Ratio: 1.7 (calc) (ref 1.0–2.5)
ALT: 18 U/L (ref 9–46)
AST: 15 U/L (ref 10–40)
Albumin: 4.8 g/dL (ref 3.6–5.1)
Alkaline phosphatase (APISO): 71 U/L (ref 36–130)
BUN: 15 mg/dL (ref 7–25)
CO2: 28 mmol/L (ref 20–32)
Calcium: 10.2 mg/dL (ref 8.6–10.3)
Chloride: 100 mmol/L (ref 98–110)
Creat: 0.88 mg/dL (ref 0.60–1.35)
GFR, Est African American: 119 mL/min/{1.73_m2} (ref >=60)
GFR, Est Non African American: 102 mL/min/{1.73_m2} (ref >=60)
Globulin: 2.9 g/dL (calc) (ref 1.9–3.7)
Glucose, Bld: 157 mg/dL — ABNORMAL HIGH (ref 65–139)
Potassium: 3.8 mmol/L (ref 3.5–5.3)
Sodium: 139 mmol/L (ref 135–146)
Total Bilirubin: 0.4 mg/dL (ref 0.2–1.2)
Total Protein: 7.7 g/dL (ref 6.1–8.1)

## 2020-12-27 LAB — HEMOGLOBIN A1C
Hgb A1c MFr Bld: 7.2 % of total Hgb — ABNORMAL HIGH (ref <5.7)
Mean Plasma Glucose: 160 mg/dL
eAG (mmol/L): 8.9 mmol/L

## 2020-12-27 LAB — LIPID PANEL
Cholesterol: 118 mg/dL (ref <200)
HDL: 31 mg/dL — ABNORMAL LOW (ref >=40)
Non-HDL Cholesterol (Calc): 87 mg/dL (calc) (ref <130)
Total CHOL/HDL Ratio: 3.8 (calc) (ref <5.0)
Triglycerides: 603 mg/dL — ABNORMAL HIGH (ref <150)

## 2020-12-30 ENCOUNTER — Encounter (INDEPENDENT_AMBULATORY_CARE_PROVIDER_SITE_OTHER): Payer: Self-pay | Admitting: Internal Medicine

## 2021-01-12 ENCOUNTER — Ambulatory Visit: Payer: Medicaid Other | Attending: Neurology | Admitting: Neurology

## 2021-01-12 ENCOUNTER — Other Ambulatory Visit: Payer: Self-pay

## 2021-01-12 DIAGNOSIS — G4733 Obstructive sleep apnea (adult) (pediatric): Secondary | ICD-10-CM | POA: Insufficient documentation

## 2021-01-12 DIAGNOSIS — G4761 Periodic limb movement disorder: Secondary | ICD-10-CM | POA: Diagnosis not present

## 2021-01-19 NOTE — Procedures (Signed)
[place] A. Merlene Laughter, MD     [email or web address]             NOCTURNAL POLYSOMNOGRAPHY   LOCATION: [facility]  Patient Name: Daniel Arias, Daniel Arias Date: 01/12/2021 Gender: Male D.O.B: August 27, 1975 Age (years): 45 Referring Provider: Barton Fanny NP Height (inches): 72 Interpreting Physician: Phillips Odor MD, ABSM Weight (lbs): 289 RPSGT: Rosebud Poles BMI: 39 MRN: [id number] Neck Size: 20.50 CLINICAL INFORMATION Sleep Study Type: NPSG     Indication for sleep study: N/A     Epworth Sleepiness Score: 8     SLEEP STUDY TECHNIQUE As per the AASM Manual for the Scoring of Sleep and Associated Events v2.3 (April 2016) with a hypopnea requiring 4% desaturations.  The channels recorded and monitored were frontal, central and occipital EEG, electrooculogram (EOG), submentalis EMG (chin), nasal and oral airflow, thoracic and abdominal wall motion, anterior tibialis EMG, snore microphone, electrocardiogram, and pulse oximetry.  MEDICATIONS Medications self-administered by patient taken the night of the study : N/A  Current Outpatient Medications:  .  ARIPiprazole (ABILIFY) 20 MG tablet, Take 20 mg by mouth daily. , Disp: , Rfl:  .  atorvastatin (LIPITOR) 40 MG tablet, TAKE 1 TABLET BY MOUTH EVERY DAY, Disp: 90 tablet, Rfl: 0 .  Cholecalciferol 1.25 MG (50000 UT) TABS, Take 5,000 Int'l Units by mouth daily., Disp: , Rfl:  .  hydrochlorothiazide (HYDRODIURIL) 25 MG tablet, TAKE 1 TABLET BY MOUTH EVERY DAY, Disp: 90 tablet, Rfl: 0 .  JARDIANCE 10 MG TABS tablet, TAKE 1 TABLET BY MOUTH EVERY DAY, Disp: 30 tablet, Rfl: 3 .  losartan (COZAAR) 100 MG tablet, Take 1 tablet (100 mg total) by mouth daily., Disp: 90 tablet, Rfl: 1 .  metFORMIN (GLUCOPHAGE) 1000 MG tablet, TAKE 1 TABLET BY MOUTH TWICE A DAY WITH MEALS, Disp: 180 tablet, Rfl: 0 .  omega-3 acid ethyl esters (LOVAZA) 1 g capsule, Take 1 capsule by mouth 2 (two) times daily., Disp: , Rfl:       SLEEP ARCHITECTURE The study was initiated at 10:53:25 PM and ended at 5:22:32 AM.  Sleep onset time was 3.2 minutes and the sleep efficiency was 92.4%. The total sleep time was 359.5 minutes.  Stage REM latency was 70.0 minutes.  The patient spent 3.76% of the night in stage N1 sleep, 59.25% in stage N2 sleep, 9.74% in stage N3 and 27.3% in REM.  Alpha intrusion was absent.  Supine sleep was 0.00%.  RESPIRATORY PARAMETERS The overall apnea/hypopnea index (AHI) was 25.2 per hour. There were 1 total apneas, including 1 obstructive, 0 central and 0 mixed apneas. There were 150 hypopneas and 0 RERAs.  The AHI during Stage REM sleep was 69.8 per hour.  AHI while supine was N/A per hour.  The mean oxygen saturation was 89.18%. The minimum SpO2 during sleep was 82.00%.  moderate snoring was noted during this study.  CARDIAC DATA The 2 lead EKG demonstrated sinus rhythm. The mean heart rate was 58.05 beats per minute. Other EKG findings include: None.  LEG MOVEMENT DATA The total PLMS were 119 with a resulting PLMS index of 19.86. Associated arousal with leg movement index was 12.7.  IMPRESSIONS 1. Moderate obstructive sleep apnea syndrome worse during REM sleep is documented with this study. AutoPAP 8-16 is recommended. 2. 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    Delano Metz, MD Diplomate, American Board of Sleep Medicine.   ELECTRONICALLY SIGNED ON:  01/19/2021, 5:07 PM [place]: [phone]  [place]: [phone] [place]

## 2021-02-12 ENCOUNTER — Ambulatory Visit (INDEPENDENT_AMBULATORY_CARE_PROVIDER_SITE_OTHER): Payer: Medicaid Other | Admitting: Internal Medicine

## 2021-02-13 ENCOUNTER — Telehealth (INDEPENDENT_AMBULATORY_CARE_PROVIDER_SITE_OTHER): Payer: Self-pay

## 2021-02-22 ENCOUNTER — Other Ambulatory Visit (INDEPENDENT_AMBULATORY_CARE_PROVIDER_SITE_OTHER): Payer: Self-pay | Admitting: Nurse Practitioner

## 2021-02-22 ENCOUNTER — Other Ambulatory Visit (INDEPENDENT_AMBULATORY_CARE_PROVIDER_SITE_OTHER): Payer: Self-pay | Admitting: Internal Medicine

## 2021-02-26 ENCOUNTER — Other Ambulatory Visit (INDEPENDENT_AMBULATORY_CARE_PROVIDER_SITE_OTHER): Payer: Self-pay | Admitting: Nurse Practitioner

## 2021-02-26 DIAGNOSIS — K7581 Nonalcoholic steatohepatitis (NASH): Secondary | ICD-10-CM

## 2021-02-26 NOTE — Addendum Note (Signed)
Addended by: Laurence Compton on: 02/26/2021 06:32 PM   Modules accepted: Orders

## 2021-02-26 NOTE — Progress Notes (Signed)
Ordering ultrasound of the liver for further evaluation of his NASH.  Please work on getting this scheduled when you are able to.  Thank you.

## 2021-02-26 NOTE — Progress Notes (Signed)
Working on prior [employer]

## 2021-03-03 ENCOUNTER — Telehealth (INDEPENDENT_AMBULATORY_CARE_PROVIDER_SITE_OTHER): Payer: Self-pay

## 2021-03-03 NOTE — Telephone Encounter (Signed)
Patient called and left a voice message stating that he had some questions in regards to a colonoscopy.  I called this patient back and received his VM and left a detailed voice message to call back to discuss his concerns.

## 2021-03-13 ENCOUNTER — Ambulatory Visit (HOSPITAL_COMMUNITY): Payer: Medicaid Other

## 2021-03-26 ENCOUNTER — Other Ambulatory Visit (INDEPENDENT_AMBULATORY_CARE_PROVIDER_SITE_OTHER): Payer: Self-pay | Admitting: Internal Medicine

## 2021-03-27 ENCOUNTER — Encounter (INDEPENDENT_AMBULATORY_CARE_PROVIDER_SITE_OTHER): Payer: Self-pay

## 2021-05-01 ENCOUNTER — Ambulatory Visit (INDEPENDENT_AMBULATORY_CARE_PROVIDER_SITE_OTHER): Payer: Medicaid Other | Admitting: Nurse Practitioner

## 2021-05-20 ENCOUNTER — Other Ambulatory Visit (INDEPENDENT_AMBULATORY_CARE_PROVIDER_SITE_OTHER): Payer: Self-pay | Admitting: Nurse Practitioner

## 2021-05-27 ENCOUNTER — Other Ambulatory Visit (HOSPITAL_COMMUNITY): Payer: Self-pay | Admitting: Gerontology

## 2021-05-27 ENCOUNTER — Ambulatory Visit (HOSPITAL_COMMUNITY)
Admission: RE | Admit: 2021-05-27 | Discharge: 2021-05-27 | Disposition: A | Discharge status: Home / Self Care | Payer: Medicaid Other | Source: Ambulatory Visit | Attending: Gerontology | Admitting: Gerontology

## 2021-05-27 ENCOUNTER — Other Ambulatory Visit: Payer: Self-pay

## 2021-05-27 DIAGNOSIS — R059 Cough, unspecified: Secondary | ICD-10-CM | POA: Diagnosis not present

## 2021-06-02 ENCOUNTER — Other Ambulatory Visit (INDEPENDENT_AMBULATORY_CARE_PROVIDER_SITE_OTHER): Payer: Self-pay | Admitting: Nurse Practitioner

## 2021-09-15 ENCOUNTER — Other Ambulatory Visit (INDEPENDENT_AMBULATORY_CARE_PROVIDER_SITE_OTHER): Payer: Self-pay | Admitting: Nurse Practitioner

## 2022-02-28 IMAGING — DX DG CHEST 2V
2 series · 2 of 2 positions shown · non-contrast
Comparison: 09/12/2013

CLINICAL DATA: Cough of unspecified type, intermittent chest pain,
diabetes mellitus, hypertension, smoker

EXAM:
CHEST - 2 VIEW

[chest pa]
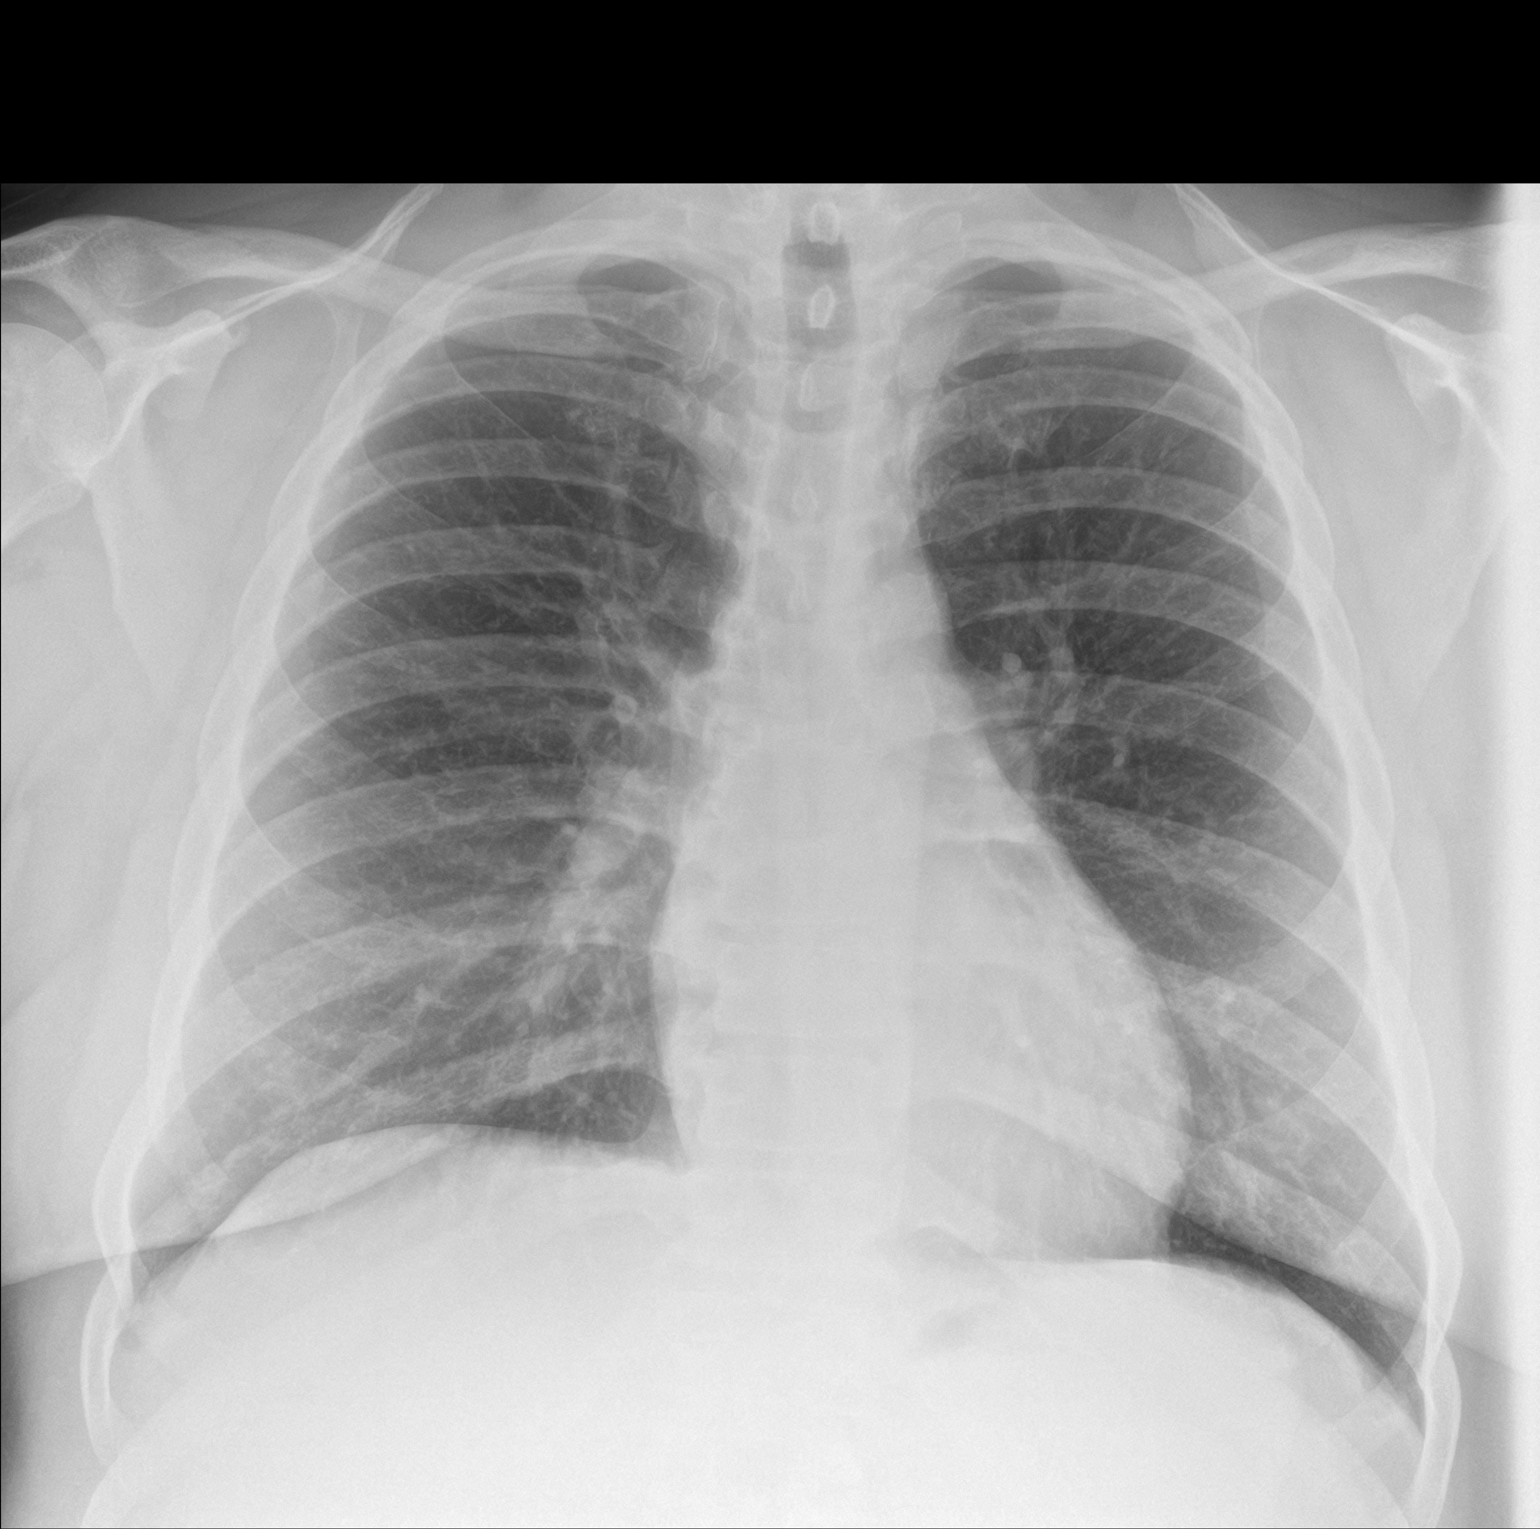

[chest lat]
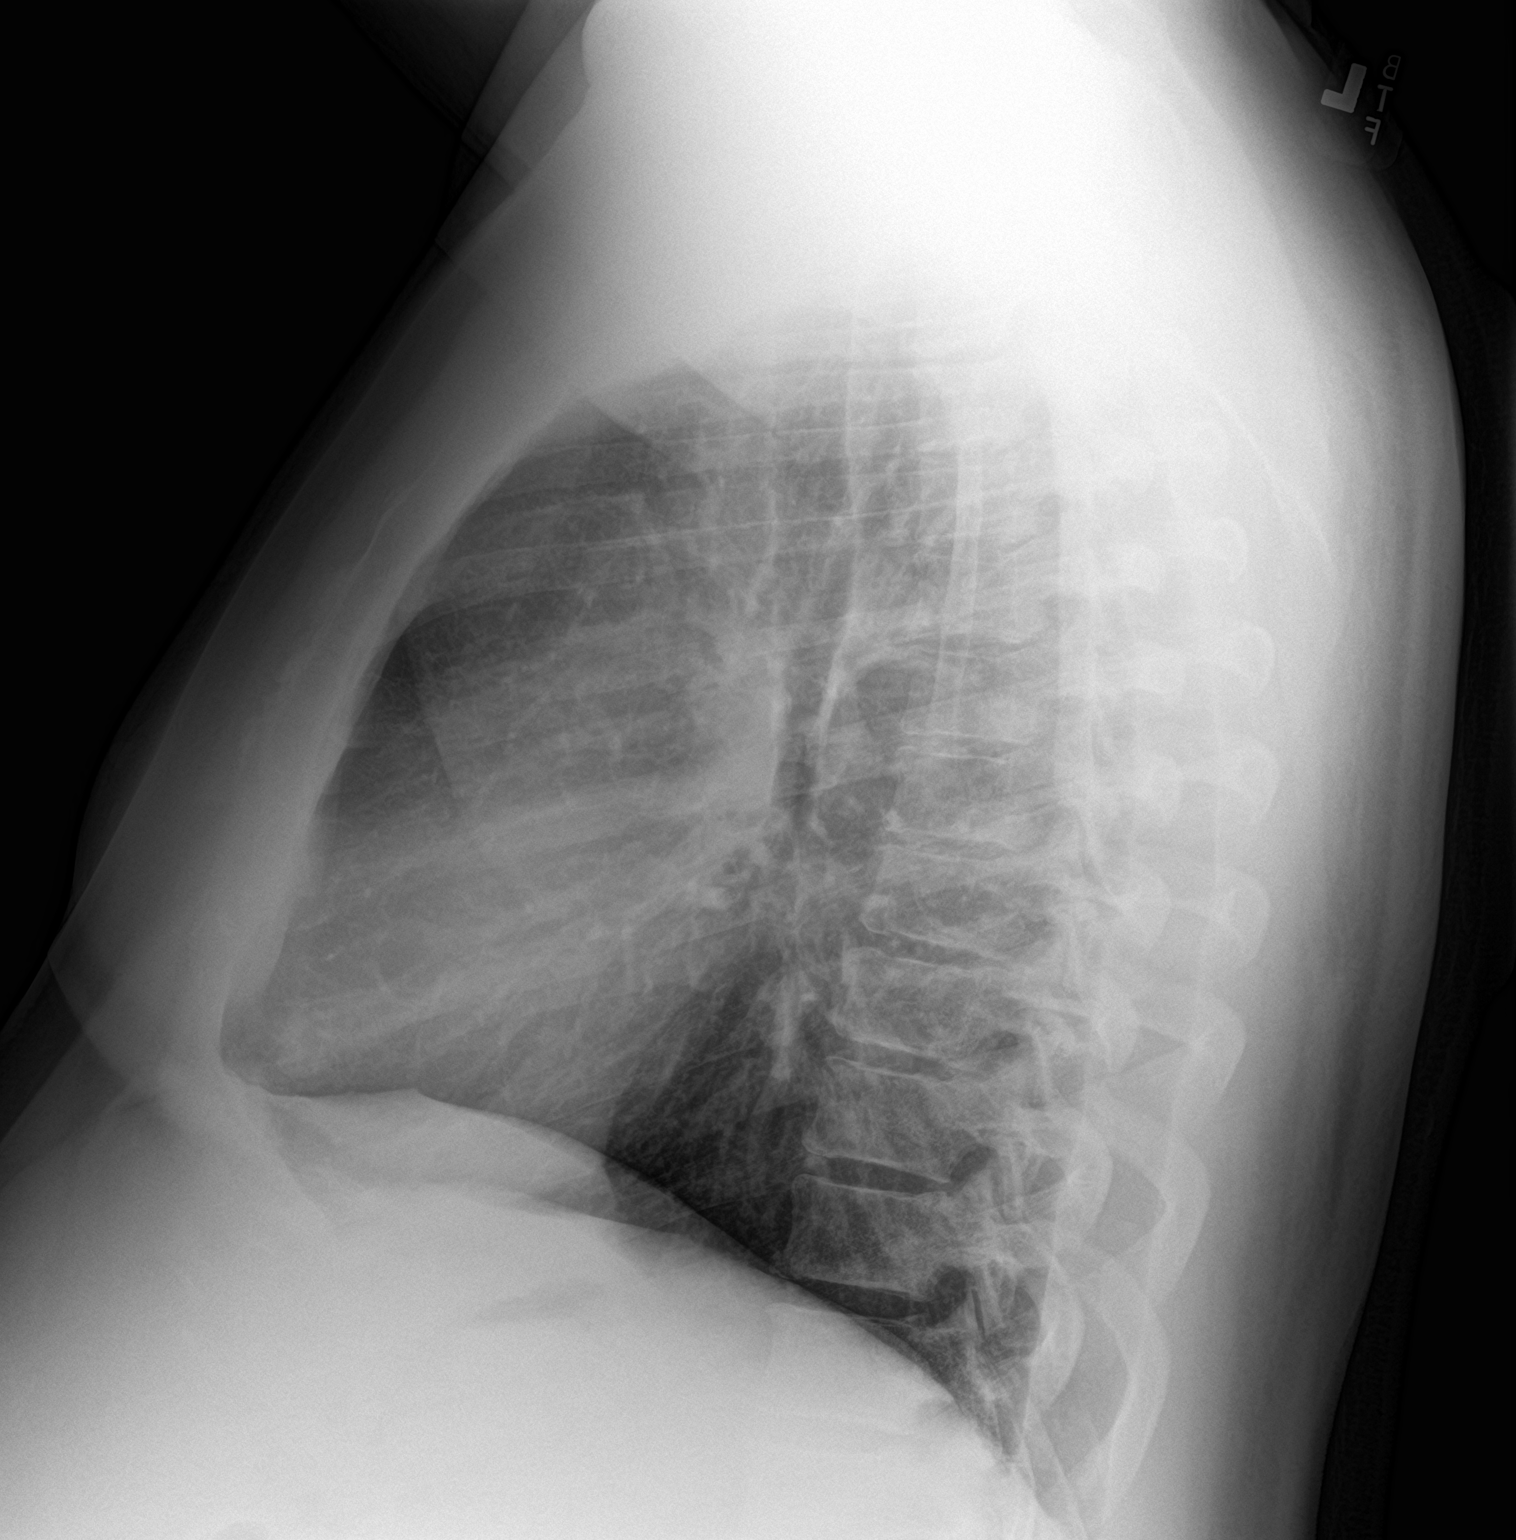

[2 of 2 positions shown; findings below may reference images not displayed]

FINDINGS: Normal heart size, mediastinal contours, and pulmonary vascularity.

Lungs clear.

No pleural effusion or pneumothorax.

Bones unremarkable.
IMPRESSION: Normal exam.

## 2023-04-26 NOTE — Progress Notes (Deleted)
04/27/23- 47 yoM Smoker for sleep evaluation with concern of OSA, courtesy of Dr Casandra Doffing, Felecia Shelling Medical problem list includes HTN, NASH, DM2, Tobacco, Obesity, Schizophrenia, Hyperlipidemia,  NPSG ([place]) 01/12/21- AHI 25.2/hr, desaturation to 82%, body weight 289 lbs

## 2023-04-27 ENCOUNTER — Institutional Professional Consult (permissible substitution): Payer: MEDICAID | Admitting: Internal Medicine

## 2023-05-10 ENCOUNTER — Encounter: Payer: Self-pay | Admitting: Internal Medicine

## 2023-06-03 ENCOUNTER — Institutional Professional Consult (permissible substitution): Payer: MEDICAID | Admitting: Internal Medicine

## 2023-06-03 NOTE — Progress Notes (Deleted)
06/03/23- 47 yoM Smoker (11 pkyrs) for sleep evaluation Medical problem list includes HTN, NASH, DM2, Paranoid Schizophrenia, Hyperlipidemia,  Obesity,  NPSG 01/12/21 (Dr Gerilyn Pilgrim) AHI 25.2/hr, desat to 82%/ Mean 89%, body weight 289 lbs Epwworth score- Body weight today-

## 2023-06-07 ENCOUNTER — Encounter (HOSPITAL_BASED_OUTPATIENT_CLINIC_OR_DEPARTMENT_OTHER): Payer: Self-pay | Admitting: Internal Medicine

## 2023-08-02 ENCOUNTER — Institutional Professional Consult (permissible substitution): Payer: MEDICAID | Admitting: Adult Health

## 2023-09-17 ENCOUNTER — Institutional Professional Consult (permissible substitution): Payer: MEDICAID | Admitting: Adult Health

## 2023-11-04 ENCOUNTER — Institutional Professional Consult (permissible substitution): Payer: MEDICAID | Admitting: Adult Health

## 2023-12-20 ENCOUNTER — Ambulatory Visit: Payer: MEDICAID | Admitting: Adult Health

## 2023-12-21 NOTE — Progress Notes (Unsigned)
 Letter

## 2024-03-24 ENCOUNTER — Telehealth: Payer: Self-pay

## 2024-03-24 NOTE — Telephone Encounter (Signed)
 Spoke with pt for precharting, on cpap and will bring SD card

## 2024-03-27 ENCOUNTER — Encounter: Payer: Self-pay | Admitting: Nurse Practitioner

## 2024-03-27 ENCOUNTER — Ambulatory Visit (INDEPENDENT_AMBULATORY_CARE_PROVIDER_SITE_OTHER): Payer: MEDICAID | Admitting: Nurse Practitioner

## 2024-03-27 VITALS — BP 137/89 | HR 112 | Ht 72.0 in | Wt 274.0 lb

## 2024-03-27 DIAGNOSIS — K0889 Other specified disorders of teeth and supporting structures: Secondary | ICD-10-CM | POA: Diagnosis not present

## 2024-03-27 DIAGNOSIS — G4734 Idiopathic sleep related nonobstructive alveolar hypoventilation: Secondary | ICD-10-CM | POA: Diagnosis not present

## 2024-03-27 DIAGNOSIS — G4733 Obstructive sleep apnea (adult) (pediatric): Secondary | ICD-10-CM | POA: Diagnosis not present

## 2024-03-27 NOTE — Assessment & Plan Note (Signed)
 Moderate OSA on CPAP. Excellent compliance and control. Receives benefit from use. Prior supplemental O2 bled through CPAP. Will obtain ONO on room air/CPAP for further evaluation. Aware of proper care/use and risks of untreated OSA. Encouraged to continue use nightly. Safe driving practices reviewed. Healthy weight loss encouraged.  Patient Instructions  Continue to use CPAP every night, minimum of 4-6 hours a night.  Change equipment as directed. Wash your tubing with warm soap and water daily, hang to dry. Wash humidifier portion weekly. Use bottled, distilled water and change daily Be aware of reduced alertness and do not drive or operate heavy machinery if experiencing this or drowsiness.  Exercise encouraged, as tolerated. Healthy weight management discussed.  Avoid or decrease alcohol consumption and medications that make you more sleepy, if possible. Notify if persistent daytime sleepiness occurs even with consistent use of PAP therapy.  Change CPAP supplies... Every month Mask cushions and/or nasal pillows CPAP machine filters Every 3 months Mask frame (not including the headgear) CPAP tubing Every 6 months Mask headgear Chin strap (if applicable) Humidifier water tub  Overnight oxygen study on CPAP and room air  If you have low oxygen levels till, we will have you do an in lab study to see about getting your oxygen back  Talk to your primary care doctor or go to Urgent Care about the abscessed tooth   Follow up in 6 weeks with Katie Richar Dunklee,NP to review ONO. If symptoms do not improve or worsen, please contact office for sooner follow up

## 2024-03-27 NOTE — Progress Notes (Addendum)
 @Patient  ID: Daniel Arias, male    DOB: 1976-05-11, 48 y.o.   MRN: [id number]  Chief Complaint  Patient presents with   Establish Care    Referring provider: No ref. provider found  HPI: 48 year old male, active smoker referred for sleep consult. Past medical history significant for HTN, NASH, DM, paranoid schizophrenia.   TEST/EVENTS:  01/12/2021 NPSG: AHI 25/h, SpO2 low 82%. no significant apneas, predominantly hypopneas   03/27/2024: Today - sleep consult  Discussed the use of AI scribe software for clinical note transcription with the patient, who gave verbal consent to proceed.  History of Present Illness Daniel Arias is a 48 year old male with moderate sleep apnea who presents for evaluation of his CPAP therapy and oxygen needs.  He has a history of moderate sleep apnea and previously used both a CPAP machine and an oxygen concentrator. A month ago, the oxygen concentrator was removed by the DME company. He felt better when using both the CPAP and oxygen concentrator. He has not had a recent overnight oxygen study to assess his current oxygen needs.  He has experienced a weight loss of around 15 pounds since his last sleep study a couple of years ago. He generally feels good during the day with the use of CPAP and has no issues with drowsy driving, sleepwalking, or morning headaches, except for a headache last night due to a toothache. He believes he has an abscessed tooth. No fevers. Has not gone to see anyone about this yet.   He uses a CPAP mask that covers his nose. He does not know if he snores at night without the CPAP, as no one has told him.  No alcohol or caffeine intake. He does not use any sleep medications. He does not work. Lives alone. Active smoker.  [facility] 12  12/29/2023-03/27/2024 CPAP 8-16 cmH2O 88/90 days; 91% >4 hr; average use 10 hr 41 min Pressure 95th 11.7 Leaks 95th 22.2 AHI 1     No Known Allergies  Immunization History  Administered Date(s)  Administered   Influenza,inj,Quad PF,6+ Mos 06/24/2016, 06/25/2017   PFIZER(Purple Top)SARS-COV-2 Vaccination 11/16/2019, 12/09/2019   Pneumococcal Conjugate-13 06/24/2016   Unspecified SARS-COV-2 Vaccination 11/23/2019, 12/23/2019    Past Medical History:  Diagnosis Date   Diabetes mellitus without complication (HCC)    Hypercholesteremia    Hypertension    Prediabetes    Schizophrenia, paranoid (HCC)    Sleep apnea    Sleep apnea 06/24/2016   Substance abuse (HCC)     Tobacco History: Social History   Tobacco Use  Smoking Status Every Day   Current packs/day: 0.50   Average packs/day: 0.5 packs/day for 22.0 years (11.0 ttl pk-yrs)   Types: Cigarettes  Smokeless Tobacco Never   Ready to quit: Not Answered Counseling given: Not Answered   Outpatient Medications Prior to Visit  Medication Sig Dispense Refill   ARIPiprazole (ABILIFY) 20 MG tablet Take 20 mg by mouth daily.      atorvastatin  (LIPITOR) 40 MG tablet TAKE 1 TABLET BY MOUTH EVERY DAY 90 tablet 0   Cholecalciferol 1.25 MG (50000 UT) TABS Take 5,000 Int'l Units by mouth daily.     hydrochlorothiazide  (HYDRODIURIL ) 25 MG tablet TAKE 1 TABLET BY MOUTH EVERY DAY 90 tablet 0   JARDIANCE 10 MG TABS tablet TAKE 1 TABLET BY MOUTH EVERY DAY 30 tablet 3   losartan  (COZAAR ) 100 MG tablet Take 1 tablet (100 mg total) by mouth daily. 90 tablet 1  metFORMIN (GLUCOPHAGE) 1000 MG tablet TAKE 1 TABLET BY MOUTH TWICE A DAY WITH MEALS 180 tablet 0   omega-3 acid ethyl esters (LOVAZA) 1 g capsule Take 1 capsule by mouth 2 (two) times daily.     No facility-administered medications prior to visit.     Review of Systems:   Constitutional: No night sweats, fevers, chills, or lassitude. +fatigue, weight change  HEENT: +headache, toothache  CV:  No chest pain, orthopnea, PND,  palpitations Resp: No snoring GI:  No heartburn, indigestion GU: No nocturia  Psych: No depression or anxiety. Mood stable.     Physical Exam:  BP  137/89   Pulse (!) 112   Ht 6' (1.829 m)   Wt 274 lb (124.3 kg)   SpO2 97% Comment: ra  BMI 37.16 kg/m   GEN: Pleasant, interactive, well-appearing; obese; in no acute distress HEENT:  Normocephalic and atraumatic. PERRLA. Sclera white. Nasal turbinates pink, moist and patent bilaterally. No rhinorrhea present. Oropharynx pink and moist, without exudate or edema. Mild edema to left face; no redness or warmth. No lesions, ulcerations, or postnasal drip.  NECK:  Supple w/ fair ROM. No lymphadenopathy.   CV: RRR, no m/r/g, no peripheral edema. Pulses intact, +2 bilaterally. No cyanosis, pallor or clubbing. PULMONARY:  Unlabored, regular breathing. Clear bilaterally A&P w/o wheezes/rales/rhonchi. No accessory muscle use.  GI: BS present and normoactive. Soft, non-tender to palpation. No organomegaly or masses detected.  MSK: No erythema, warmth or tenderness. Cap refil <2 sec all extrem.  Neuro: A/Ox3. No focal deficits noted.   Skin: Warm, no lesions or rashe Psych: Normal affect and behavior. Judgement and thought content appropriate.     Lab Results:  CBC    Component Value Date/Time   WBC 10.2 10/10/2020 1401   RBC 5.64 10/10/2020 1401   HGB 15.2 10/10/2020 1401   HCT 46.1 10/10/2020 1401   PLT 313 10/10/2020 1401   MCV 81.7 10/10/2020 1401   MCH 27.0 10/10/2020 1401   MCHC 33.0 10/10/2020 1401   RDW 14.4 10/10/2020 1401   LYMPHSABS 2,805 10/10/2020 1401   MONOABS 0.8 09/12/2013 1534   EOSABS 265 10/10/2020 1401   BASOSABS 31 10/10/2020 1401    BMET    Component Value Date/Time   NA 139 12/26/2020 1429   K 3.8 12/26/2020 1429   CL 100 12/26/2020 1429   CO2 28 12/26/2020 1429   GLUCOSE 157 (H) 12/26/2020 1429   BUN 15 12/26/2020 1429   CREATININE 0.88 12/26/2020 1429   CALCIUM  10.2 12/26/2020 1429   GFRNONAA 102 12/26/2020 1429   GFRAA 119 12/26/2020 1429    BNP No results found for: BNP   Imaging:  No results found.  Administration History     None            No data to display          No results found for: NITRICOXIDE      Assessment & Plan:   Sleep apnea Moderate OSA on CPAP. Excellent compliance and control. Receives benefit from use. Prior supplemental O2 bled through CPAP. Will obtain ONO on room air/CPAP for further evaluation. Aware of proper care/use and risks of untreated OSA. Encouraged to continue use nightly. Safe driving practices reviewed. Healthy weight loss encouraged.  Patient Instructions  Continue to use CPAP every night, minimum of 4-6 hours a night.  Change equipment as directed. Wash your tubing with warm soap and water daily, hang to dry. Wash humidifier portion weekly. Use bottled,  distilled water and change daily Be aware of reduced alertness and do not drive or operate heavy machinery if experiencing this or drowsiness.  Exercise encouraged, as tolerated. Healthy weight management discussed.  Avoid or decrease alcohol consumption and medications that make you more sleepy, if possible. Notify if persistent daytime sleepiness occurs even with consistent use of PAP therapy.  Change CPAP supplies... Every month Mask cushions and/or nasal pillows CPAP machine filters Every 3 months Mask frame (not including the headgear) CPAP tubing Every 6 months Mask headgear Chin strap (if applicable) Humidifier water tub  Overnight oxygen study on CPAP and room air  If you have low oxygen levels till, we will have you do an in lab study to see about getting your oxygen back  Talk to your primary care doctor or go to Urgent Care about the abscessed tooth   Follow up in 6 weeks with Katie Nikisha Fleece,NP to review ONO. If symptoms do not improve or worsen, please contact office for sooner follow up    Toothache Possible abscessed tooth. No fevers, chills, difficulties swallowing. Advised to contact PCP or go to UC for further management. He will get in with a dentist ASAP.    Advised if symptoms do not  improve or worsen, to please contact office for sooner follow up or seek emergency care.   I spent 45 minutes of dedicated to the care of this patient on the date of this encounter to include pre-visit review of records, face-to-face time with the patient discussing conditions above, post visit ordering of testing, clinical documentation with the electronic health record, making appropriate referrals as documented, and communicating necessary findings to members of the patients care team.  Comer LULLA Rouleau, NP 03/27/2024  Pt aware and understands NP's role.

## 2024-03-27 NOTE — Assessment & Plan Note (Signed)
 Possible abscessed tooth. No fevers, chills, difficulties swallowing. Advised to contact PCP or go to UC for further management. He will get in with a dentist ASAP.

## 2024-03-27 NOTE — Patient Instructions (Addendum)
 Continue to use CPAP every night, minimum of 4-6 hours a night.  Change equipment as directed. Wash your tubing with warm soap and water daily, hang to dry. Wash humidifier portion weekly. Use bottled, distilled water and change daily Be aware of reduced alertness and do not drive or operate heavy machinery if experiencing this or drowsiness.  Exercise encouraged, as tolerated. Healthy weight management discussed.  Avoid or decrease alcohol consumption and medications that make you more sleepy, if possible. Notify if persistent daytime sleepiness occurs even with consistent use of PAP therapy.  Change CPAP supplies... Every month Mask cushions and/or nasal pillows CPAP machine filters Every 3 months Mask frame (not including the headgear) CPAP tubing Every 6 months Mask headgear Chin strap (if applicable) Humidifier water tub  Overnight oxygen study on CPAP and room air  If you have low oxygen levels till, we will have you do an in lab study to see about getting your oxygen back  Talk to your primary care doctor or go to Urgent Care about the abscessed tooth   Follow up in 6 weeks with Katie Jerimy Johanson,NP to review ONO. If symptoms do not improve or worsen, please contact office for sooner follow up

## 2024-05-01 ENCOUNTER — Telehealth: Payer: Self-pay | Admitting: Nurse Practitioner

## 2024-05-01 DIAGNOSIS — G4734 Idiopathic sleep related nonobstructive alveolar hypoventilation: Secondary | ICD-10-CM

## 2024-05-01 DIAGNOSIS — G4733 Obstructive sleep apnea (adult) (pediatric): Secondary | ICD-10-CM

## 2024-05-01 NOTE — Telephone Encounter (Signed)
 Received ONO on CPAP done by [facility] 04/19/24-04/20/24  Time spent < 88% 24 min 48 sec  Time spent  89% 50 min 40 sec    Katie- the pt was supposed to have f/u with you in [place] but there is not a scheduled for you there that is open. Would you like him to f/u with a sleep doc in [place]?

## 2024-05-03 NOTE — Telephone Encounter (Signed)
 Pt is aware of results and voiced his understanding. 2L ordered. Appt scheduled 05/10/2024. Nothing further is needed.

## 2024-05-03 NOTE — Telephone Encounter (Signed)
 Can schedule video visit with me for follow up. He needs to resume O2 bled through CPAP at 2 lpm. Please place order to DME (they removed his concentrator a few months ago). Thanks.

## 2024-05-03 NOTE — Addendum Note (Signed)
 Addended by: Stefana Lodico V on: 05/03/2024 05:16 PM   Modules accepted: Orders

## 2024-05-03 NOTE — Telephone Encounter (Signed)
 Pt called back and tried to schedule video visit, but could not find one available.  Pt wlll be looking for a c/b today to discuss and schedule the visit

## 2024-05-03 NOTE — Telephone Encounter (Signed)
 Lm x1 for patient.

## 2024-05-04 ENCOUNTER — Telehealth: Payer: Self-pay | Admitting: Nurse Practitioner

## 2024-05-04 DIAGNOSIS — G4734 Idiopathic sleep related nonobstructive alveolar hypoventilation: Secondary | ICD-10-CM

## 2024-05-04 DIAGNOSIS — G4733 Obstructive sleep apnea (adult) (pediatric): Secondary | ICD-10-CM

## 2024-05-04 NOTE — Telephone Encounter (Signed)
 Per Avelina at [facility] - We would need a Titrated sleep study since the PT is on PAP therapy since he has OSA.

## 2024-05-04 NOTE — Telephone Encounter (Signed)
 CPAP titration study ordered. Please inform pt and PCCs. Reschedule his follow up with me to 10-12 weeks after titration study. Thanks.

## 2024-05-04 NOTE — Telephone Encounter (Signed)
 Patient is aware of below message and voiced his understanding.  PCC's, please schedule titration and OV 10-12 weeks after study. Thanks

## 2024-05-05 NOTE — Telephone Encounter (Signed)
 This has been sent to scheduling

## 2024-05-08 NOTE — Telephone Encounter (Signed)
 Pt has been made aware of appt. NFN.

## 2024-05-08 NOTE — Telephone Encounter (Signed)
 This has been scheduled for 11/10.  Has the patient been notified?

## 2024-05-10 ENCOUNTER — Ambulatory Visit: Payer: MEDICAID | Admitting: Nurse Practitioner

## 2024-07-03 ENCOUNTER — Ambulatory Visit (HOSPITAL_BASED_OUTPATIENT_CLINIC_OR_DEPARTMENT_OTHER): Payer: MEDICAID | Attending: Nurse Practitioner | Admitting: Internal Medicine

## 2024-07-03 DIAGNOSIS — G4733 Obstructive sleep apnea (adult) (pediatric): Secondary | ICD-10-CM | POA: Insufficient documentation

## 2024-07-03 DIAGNOSIS — G4734 Idiopathic sleep related nonobstructive alveolar hypoventilation: Secondary | ICD-10-CM

## 2024-07-05 ENCOUNTER — Ambulatory Visit: Payer: MEDICAID | Admitting: Nurse Practitioner

## 2024-07-08 DIAGNOSIS — G4733 Obstructive sleep apnea (adult) (pediatric): Secondary | ICD-10-CM | POA: Diagnosis not present

## 2024-07-08 NOTE — Procedures (Signed)
 [facility] [address] [place], [place] [address] Tel: [phone]   Fax: [phone]  Titration Interpretation  Patient Name:  Daniel Arias, Daniel Arias Study Date:  07/03/2024 Referring Physician:  COBB, KATHERINE V., NP %%startinterp%% Indications for Polysomnography The patient is a 48 year old Male who is 6' 2 and weighs 275.0 lbs. His BMI equals 35.3.  A full night titration treatment study was performed.  Medications Taken:  NO MEDICATIONS TAKEN.   Polysomnogram Data A full night polysomnogram recorded the standard physiologic parameters including EEG, EOG, EMG, EKG, nasal and oral airflow.  Respiratory parameters of chest and abdominal movements were recorded with Respiratory Inductance Plethysmography belts.  Oxygen saturation was recorded by pulse oximetry.   Sleep Architecture The total recording time of the polysomnogram was 460.7 minutes.  The total sleep time was 436.5 minutes.  The patient spent 3.0% of total sleep time in Stage N1, 56.8% in Stage N2, 14.9% in Stages N3, and 25.3% in REM.  Sleep latency was 5.4 minutes.  REM latency was 63.5 minutes.  Sleep Efficiency was 94.8%.  Wake after Sleep Onset time was 18.5 minutes.   Respiratory Events The polysomnogram revealed a presence of - obstructive, 1 central, and - mixed apneas resulting in an Apnea index of 0.1 events per hour.  There were 117 hypopneas (>=3% desaturation and/or arousal) resulting in an Apnea\Hypopnea Index (AHI >=3% desaturation and/or arousal) of 16.2 events per hour.  There were 57 hypopneas (>=4% desaturation) resulting in an Apnea\Hypopnea Index (AHI >=4% desaturation) of 8.0 events per hour.  There were 128 Respiratory Effort Related Arousals resulting in a RERA index of 17.6 events per hour. The Respiratory Disturbance Index is 33.8 events per hour.  The snore index was 85.8 events per hour.  Mean oxygen saturation was 90.0%.  The lowest oxygen saturation during  sleep was 83.0%.  Time spent <=88% oxygen saturation was 24.4 minutes (5.3%).  Limb Activity There were 94 limb movements recorded.  Of this total, 93 were classified as PLMs.  Of the PLMs, 4 were associated with arousals.  The Limb Movement index was 12.9 per hour while the PLM index was 12.8 per hour.  Cardiac Summary The average pulse rate was 86.0 bpm.  The minimum pulse rate was 67.0 bpm while the maximum pulse rate was 104.0 bpm.  Cardiac rhythm was normal.    Comment:  CPAP titration provided inadequate control at maximum pressure and changed  to BIPAP. Tech titrated to BIPAP 27/19, but retail BIPAP machines only go to max 25 cwp. At 25/17, residual AHI 4%) 0/hr, with nadir saturation 88%, mean 90.9%.   Diagnosis: Obstructive sleep apnea  Recommendations:  Suggest BIPAP 25/18 or autoBIPAP. Patient wore a large F&P Eson 2 nasal mask with heated humidity.   This study was personally reviewed and electronically signed by: NEYSA REGGY BIRCH., MD Accredited Board Certified in Sleep Medicine Date/Time: 07/08/24    1:03    %%endinterp%%  Titration Report  Patient Name: Daniel Arias, Daniel Arias Study Date: 07/03/2024  Date of Birth: 1976-02-21 Study Type: CPAP Titration  Age: 48 year MRN #: [id number]  Sex: Male Interpreting Physician: NEYSA REGGY, [id number]  Height: 6' 2 Referring Physician: COBB, KATHERINE V., NP  Weight: 275.0 lbs Recording Tech: Charlie George RPSGT  BMI: 35.3 Scoring Tech: Charlie George RPSGT  ESS: 9/24 Neck Size: 19  Mask Type F&P ESON 2 NASAL Final Pressure: 27/19 CM H2O  Mask Size: LARGE Supplemental O2: 0   Study Overview  Lights Off: 09:10:30 PM  Count Index  Lights On: 04:51:10 AM Awakenings: 22 3.0  Time in Bed: 460.7 min. Arousals: 164 22.5  Total Sleep Time: 436.5 min. AHI (>=3% Desat and/or Ar.): 118 16.2   Sleep Efficiency: 94.8% AHI (>=4% Desat): 58 8.0   Sleep Latency: 5.4 min. Limb Movements: 94 12.9  Wake After Sleep Onset: 18.5 min. Snore: 624 85.8   REM Latency from Sleep Onset: 63.5 min. Desaturations: 121 16.6     Minimum SpO2 TST: 83.0%    Sleep Architecture  % of Time in Bed Stages Time (mins) % Sleep Time  Wake 24.5   Stage N1 13.0 3.0%  Stage N2 248.0 56.8%  Stage N3 65.0 14.9%  REM 110.5 25.3%   Arousal Summary   NREM REM Sleep Index  Respiratory Arousals 136 16 152 20.9  PLM Arousals 4 - 4 0.5  Isolated Limb Movement Arousals - - - -  Snore Arousals 1 - 1 0.1  Spontaneous Arousals 7 - 7 1.0  Total 148 16 164 22.5   Limb Movement Summary   Count Index  Isolated Limb Movements 1 0.1  Periodic Limb Movements (PLMs) 93 12.8  Total Limb Movements 94 12.9    Respiratory Summary   By Sleep Stage By Body Position Total   NREM REM Supine Non-Supine   Time (min) 326.0 110.5 84.0 352.5 436.5         Obstructive Apnea - - - - -  Mixed Apnea - - - - -  Central Apnea - 1 - 1 1  Total Apneas - 1 - 1 1  Total Apnea Index - 0.5 - 0.2 0.1         Hypopneas (>=3% Desat and/or Ar.) 63 54 19 98 117  AHI (>=3% Desat and/or Ar.) 11.6 29.9 13.6 16.9 16.2         Hypopneas (>=4% Desat) 26 31 6  51 57  AHI (>=4% Desat) 4.8 17.4 4.3 8.9 8.0          RERAs 112 16 12 116 128  RERA Index 20.6 8.7 8.6 19.7 17.6         RDI 32.2 38.6 22.1 36.6 33.8    Respiratory Event Type Index  Central Apneas 0.1  Obstructive Apneas -  Mixed Apneas -  Central Hypopneas -  Obstructive Hypopneas 15.9  Central Apnea + Hypopnea (CAHI) 0.1  Obstructive Apnea + Hypopnea (OAHI) 16.2   Respiratory Event Durations   Apnea Hypopnea   NREM REM NREM REM  Average (seconds) - 11.5 21.0 19.5  Maximum (seconds) - 11.5 41.0 35.6    Oxygen Saturation Summary   Wake NREM REM TST TIB  Average SpO2 91.2% 90.0% 90.1% 90.0% 90.0%  Minimum SpO2 85.0% 84.0% 83.0% 83.0% 83.0%  Maximum SpO2 95.0% 96.0% 96.0% 96.0% 96.0%   Oxygen Saturation Distribution  Range (%) Time in range (min) Time in range (%)  90.0 - 100.0 130.2 28.5%  80.0 - 90.0 322.8  70.7%  70.0 - 80.0 - -  60.0 - 70.0 - -  50.0 - 60.0 - -  0.0 - 50.0 - -  Time Spent <=88% SpO2  Range (%) Time in range (min) Time in range (%)  0.0 - 88.0 24.4 5.3%      Count Index  Desaturations 121 16.6    Cardiac Summary   Wake NREM REM Sleep Total  Average Pulse Rate (BPM) 89.7 86.7 83.4 85.8 86.0  Minimum Pulse Rate (BPM) 70.0 67.0 68.0 67.0  67.0  Maximum Pulse Rate (BPM) 104.0 103.0 100.0 103.0 104.0   Pulse Rate Distribution:  Range (bpm) Time in range (min) Time in range (%)  0.0 - 40.0 - -  40.0 - 60.0 - -  60.0 - 80.0 48.2 10.5%  80.0 - 100.0 404.9 88.6%  100.0 - 120.0 0.4 0.1%  120.0 - 140.0 - -  140.0 - 200.0 - -   Titration Summary  PAP Device PAP Level O2 Level Time (min) Wake (min) NREM (min) REM (min) Supine TST (min) Sleep Eff% OA# CA# MA# Hyp# (>=3%) AHI (>=3%) Hyp# (>=4%) AHI (>=%4) RERA RDI SpO2 <=88% (min) Min SpO2 Mean SpO2 Ar. Index  CPAP 5 - 35.5 6.5 29.0 0.0  19.0 81.7% - - - 10 20.7 3  6.2 15  51.7  1.4 86.0 89.7 39.3  CPAP 7 - 32.5 0.5 32.0 0.0  26.5 98.5% - - - 2 3.8 1  1.9 1  5.6  1.0 88.0 89.3 7.5  CPAP 8 - 12.5 0.0 3.5 9.0  100.0% - - - 14 67.2 12  57.6 5  91.2  5.0 83.0 88.6 38.4  CPAP 10 - 17.5 0.5 4.5 12.5  97.1% - - - 16 56.5 12  42.4 1  60.0  3.2 85.0 89.7 14.1  CPAP 12 - 11.0 0.0 5.5 5.5  4.5 100.0% - - - 8 43.6 7  38.2 3  60.0  0.9 86.0 90.4 21.8  CPAP 14 - 27.5 1.0 26.5 0.0  1.5 96.4% - - - 3 6.8 1  2.3 12  34.0  0.0 89.0 90.1 29.4  CPAP 15 - 37.5 0.0 37.5 0.0  27.5 100.0% - - - 4 6.4 1  1.6 8  19.2  0.4 88.0 89.5 12.8  CPAP 16 - 22.0 0.0 22.0 0.0  100.0% - - - 4 10.9 1  2.7 3  19.1  0.1 88.0 89.7 10.9  CPAP 17 - 26.0 1.5 23.0 1.5  94.2% - - - 4 9.8 2  4.9 12  39.2  0.9 84.0 90.1 26.9  CPAP 19 - 13.5 0.0 1.0 12.5  100.0% - - - 7 31.1 2  8.9 1  35.6  5.5 86.0 88.8 8.9  CPAP 20 - 1.5 0.0 0.0 1.5  100.0% - - - 2 80.0 1  40.0 -  80.0  0.3 87.0 89.8 -  CPAP EPR 20/2 - 14.0 0.5 3.0 10.5  96.4% - - - 3 13.3 1  4.4 2  22.2  1.0 87.0  89.9 4.4  BiLevel 21/17/0 - 15.5 0.0 3.0 12.5  100.0% - - - 4 15.5 2  7.7 3  27.1  0.9 87.0 90.4 11.6  BiLevel 22/16/0 - 36.5 0.5 24.5 11.5  98.6% - - - 4 6.7 2  3.3 4  13.3  0.8 88.0 90.0 8.3  BiLevel 23/15/0 - 39.0 1.5 37.5 0.0  2.5 96.2% - - - 9 14.4 5  8.0 19  44.8  1.6 87.0 89.6 43.2  BiLevel 24/16/0 - 62.5 4.0 58.5 0.0  93.6% - - - 8 8.2 2  2.1 33  42.1  0.2 88.0 90.3 39.0  BiLevel 25/17/0 - 13.5 0.5 8.5 4.5  96.3% - - - 5 23.1 -  - -  23.1  0.4 88.0 90.9 9.2  BiLevel 26/18/0 - 13.0 0.0 0.0 13.0  100.0% - - - 2 9.2 2  9.2 1  13.8  0.0 89.0 90.9 4.6  BiLevel 27/19/0 - 30.0 7.5 6.5 16.0  2.5 75.0% - 1 - 8 24.0 -  2.7 5  37.3  0.0 89.0 92.1 26.7   Hypnograms                           Technologist Comments            The patient arrived for a CPAP titration study. The patient was placed in room 1. The patient has a history of CPAP use with nocturnal oxygen. The patient stated that insurance took the concentrator away and he is here to see if he needs it. The patient brought a large F&P Eson nasal mask with him. The mask looked in poor condition. The procedure was explained, and questions were answered.           CPAP was started at a pressure of 5 cm H2O and was increased to a pressure of 20 cm H2O. EPR was added at the final pressure of CPAP to help with comfort on the high pressure. The patient continued to have obstructive events, and due to the high pressure on CPAP, the patient was switched to BPAP. BPAP was started at a pressure of 21/17 cm H2O and was increased to 27/19 cm H2O. The patient tolerated the pressures well. A large F&P Eson 2 nasal mask was used during the titration.           The patient slept in the right and supine positions. Patient stated that a doctor told him not to sleep supine. Moderate PLMs and mild oral venting were observed. No bruxism, seizure, or spike wave activity was observed. No ECG events were observed. Snoring was mild and not audible.  All stages of sleep were recorded. REM was achieved at the final pressure, but not in the supine position. The patient had one-bathroom break.                          Reggy Salt Diplomate, Biomedical Engineer of Sleep Medicine  ELECTRONICALLY SIGNED ON:  07/08/2024, 1:07 PM [facility] PH: [phone]   FX: [phone] ACCREDITED BY THE [employer]ICAN ACADEMY OF SLEEP MEDICINE

## 2024-07-08 NOTE — Procedures (Signed)
 Darryle Law [facility] [address] [place], [place] [address] Tel: [phone]   Fax: [phone]  Titration Interpretation  Patient Name:  Daniel Arias, Daniel Arias Study Date:  07/03/2024 Referring Physician:  COBB, KATHERINE V., NP %%startinterp%% Indications for Polysomnography The patient is a 48 year old Male who is 6' 2 and weighs 275.0 lbs. His BMI equals 35.3.  A full night titration treatment study was performed.  Medications Taken:  NO MEDICATIONS TAKEN.   Polysomnogram Data A full night polysomnogram recorded the standard physiologic parameters including EEG, EOG, EMG, EKG, nasal and oral airflow.  Respiratory parameters of chest and abdominal movements were recorded with Respiratory Inductance Plethysmography belts.  Oxygen saturation was recorded by pulse oximetry.   Sleep Architecture The total recording time of the polysomnogram was 460.7 minutes.  The total sleep time was 436.5 minutes.  The patient spent 3.0% of total sleep time in Stage N1, 56.8% in Stage N2, 14.9% in Stages N3, and 25.3% in REM.  Sleep latency was 5.4 minutes.  REM latency was 63.5 minutes.  Sleep Efficiency was 94.8%.  Wake after Sleep Onset time was 18.5 minutes.    Respiratory Events The polysomnogram revealed a presence of - obstructive, 1 central, and - mixed apneas resulting in an Apnea index of 0.1 events per hour.  There were 117 hypopneas (>=3% desaturation and/or arousal) resulting in an Apnea\Hypopnea Index (AHI >=3% desaturation and/or arousal) of 16.2 events per hour.  There were 57 hypopneas (>=4% desaturation) resulting in an Apnea\Hypopnea Index (AHI >=4% desaturation) of 8.0 events per hour.  There were 128 Respiratory Effort Related Arousals resulting in a RERA index of 17.6 events per hour. The Respiratory Disturbance Index is 33.8 events per hour.  The snore index was 85.8 events per hour.  Mean oxygen saturation was 90.0%.  The lowest oxygen saturation during  sleep was 83.0%.  Time spent <=88% oxygen saturation was 24.4 minutes (5.3%).  Limb Activity There were 94 limb movements recorded.  Of this total, 93 were classified as PLMs.  Of the PLMs, 4 were associated with arousals.  The Limb Movement index was 12.9 per hour while the PLM index was 12.8 per hour.  Cardiac Summary The average pulse rate was 86.0 bpm.  The minimum pulse rate was 67.0 bpm while the maximum pulse rate was 104.0 bpm.  Cardiac rhythm was normal.    Comment:  CPAP titration provided inadequate control at maximum pressure and changed  to BIPAP. Tech titrated to BIPAP 27/19, but retail BIPAP machines only go to max 25 cwp. At 25/17, residual AHI 4%) 0/hr, with nadir saturation 88%, mean 90.9%.   Diagnosis: Obstructive sleep apnea  Recommendations:  Suggest BIPAP 25/18 or autoBIPAP. Patient wore a large F&P Eson 2 nasal mask with heated humidity.   This study was personally reviewed and electronically signed by: NEYSA REGGY BIRCH., MD Accredited Board Certified in Sleep Medicine Date/Time: 07/08/24    1:03    %%endinterp%%  Titration Report  Patient Name: Daniel Arias, Daniel Arias Study Date: 07/03/2024  Date of Birth: 1976-05-11 Study Type: CPAP Titration  Age: 48 year MRN #: [id number]  Sex: Male Interpreting Physician: NEYSA REGGY, [id number]  Height: 6' 2 Referring Physician: COBB, KATHERINE V., NP  Weight: 275.0 lbs Recording Tech: Charlie George RPSGT  BMI: 35.3 Scoring Tech: Charlie George RPSGT  ESS: 9/24 Neck Size: 19  Mask Type F&P ESON 2 NASAL Final Pressure: 27/19 CM H2O  Mask Size: LARGE Supplemental O2: 0   Study Overview  Lights Off: 09:10:30 PM  Count Index  Lights On: 04:51:10 AM Awakenings: 22 3.0  Time in Bed: 460.7 min. Arousals: 164 22.5  Total Sleep Time: 436.5 min. AHI (>=3% Desat and/or Ar.): 118 16.2   Sleep Efficiency: 94.8% AHI (>=4% Desat): 58 8.0   Sleep Latency: 5.4 min. Limb Movements: 94 12.9  Wake After Sleep Onset: 18.5 min. Snore: 624 85.8   REM Latency from Sleep Onset: 63.5 min. Desaturations: 121 16.6     Minimum SpO2 TST: 83.0%    Sleep Architecture  % of Time in Bed Stages Time (mins) % Sleep Time  Wake 24.5   Stage N1 13.0 3.0%  Stage N2 248.0 56.8%  Stage N3 65.0 14.9%  REM 110.5 25.3%   Arousal Summary   NREM REM Sleep Index  Respiratory Arousals 136 16 152 20.9  PLM Arousals 4 - 4 0.5  Isolated Limb Movement Arousals - - - -  Snore Arousals 1 - 1 0.1  Spontaneous Arousals 7 - 7 1.0  Total 148 16 164 22.5   Limb Movement Summary   Count Index  Isolated Limb Movements 1 0.1  Periodic Limb Movements (PLMs) 93 12.8  Total Limb Movements 94 12.9    Respiratory Summary   By Sleep Stage By Body Position Total   NREM REM Supine Non-Supine   Time (min) 326.0 110.5 84.0 352.5 436.5         Obstructive Apnea - - - - -  Mixed Apnea - - - - -  Central Apnea - 1 - 1 1  Total Apneas - 1 - 1 1  Total Apnea Index - 0.5 - 0.2 0.1         Hypopneas (>=3% Desat and/or Ar.) 63 54 19 98 117  AHI (>=3% Desat and/or Ar.) 11.6 29.9 13.6 16.9 16.2         Hypopneas (>=4% Desat) 26 31 6  51 57  AHI (>=4% Desat) 4.8 17.4 4.3 8.9 8.0          RERAs 112 16 12 116 128  RERA Index 20.6 8.7 8.6 19.7 17.6         RDI 32.2 38.6 22.1 36.6 33.8    Respiratory Event Type Index  Central Apneas 0.1  Obstructive Apneas -  Mixed Apneas -  Central Hypopneas -  Obstructive Hypopneas 15.9  Central Apnea + Hypopnea (CAHI) 0.1  Obstructive Apnea + Hypopnea (OAHI) 16.2   Respiratory Event Durations   Apnea Hypopnea   NREM REM NREM REM  Average (seconds) - 11.5 21.0 19.5  Maximum (seconds) - 11.5 41.0 35.6    Oxygen Saturation Summary   Wake NREM REM TST TIB  Average SpO2 91.2% 90.0% 90.1% 90.0% 90.0%  Minimum SpO2 85.0% 84.0% 83.0% 83.0% 83.0%  Maximum SpO2 95.0% 96.0% 96.0% 96.0% 96.0%   Oxygen Saturation Distribution  Range (%) Time in range (min) Time in range (%)  90.0 - 100.0 130.2 28.5%  80.0 - 90.0 322.8  70.7%  70.0 - 80.0 - -  60.0 - 70.0 - -  50.0 - 60.0 - -  0.0 - 50.0 - -  Time Spent <=88% SpO2  Range (%) Time in range (min) Time in range (%)  0.0 - 88.0 24.4 5.3%      Count Index  Desaturations 121 16.6    Cardiac Summary   Wake NREM REM Sleep Total  Average Pulse Rate (BPM) 89.7 86.7 83.4 85.8 86.0  Minimum Pulse Rate (BPM) 70.0 67.0 68.0 67.0  67.0  Maximum Pulse Rate (BPM) 104.0 103.0 100.0 103.0 104.0   Pulse Rate Distribution:  Range (bpm) Time in range (min) Time in range (%)  0.0 - 40.0 - -  40.0 - 60.0 - -  60.0 - 80.0 48.2 10.5%  80.0 - 100.0 404.9 88.6%  100.0 - 120.0 0.4 0.1%  120.0 - 140.0 - -  140.0 - 200.0 - -   Titration Summary  PAP Device PAP Level O2 Level Time (min) Wake (min) NREM (min) REM (min) Supine TST (min) Sleep Eff% OA# CA# MA# Hyp# (>=3%) AHI (>=3%) Hyp# (>=4%) AHI (>=%4) RERA RDI SpO2 <=88% (min) Min SpO2 Mean SpO2 Ar. Index  CPAP 5 - 35.5 6.5 29.0 0.0  19.0 81.7% - - - 10 20.7 3  6.2 15  51.7  1.4 86.0 89.7 39.3  CPAP 7 - 32.5 0.5 32.0 0.0  26.5 98.5% - - - 2 3.8 1  1.9 1  5.6  1.0 88.0 89.3 7.5  CPAP 8 - 12.5 0.0 3.5 9.0  100.0% - - - 14 67.2 12  57.6 5  91.2  5.0 83.0 88.6 38.4  CPAP 10 - 17.5 0.5 4.5 12.5  97.1% - - - 16 56.5 12  42.4 1  60.0  3.2 85.0 89.7 14.1  CPAP 12 - 11.0 0.0 5.5 5.5  4.5 100.0% - - - 8 43.6 7  38.2 3  60.0  0.9 86.0 90.4 21.8  CPAP 14 - 27.5 1.0 26.5 0.0  1.5 96.4% - - - 3 6.8 1  2.3 12  34.0  0.0 89.0 90.1 29.4  CPAP 15 - 37.5 0.0 37.5 0.0  27.5 100.0% - - - 4 6.4 1  1.6 8  19.2  0.4 88.0 89.5 12.8  CPAP 16 - 22.0 0.0 22.0 0.0  100.0% - - - 4 10.9 1  2.7 3  19.1  0.1 88.0 89.7 10.9  CPAP 17 - 26.0 1.5 23.0 1.5  94.2% - - - 4 9.8 2  4.9 12  39.2  0.9 84.0 90.1 26.9  CPAP 19 - 13.5 0.0 1.0 12.5  100.0% - - - 7 31.1 2  8.9 1  35.6  5.5 86.0 88.8 8.9  CPAP 20 - 1.5 0.0 0.0 1.5  100.0% - - - 2 80.0 1  40.0 -  80.0  0.3 87.0 89.8 -  CPAP EPR 20/2 - 14.0 0.5 3.0 10.5  96.4% - - - 3 13.3 1  4.4 2  22.2  1.0 87.0  89.9 4.4  BiLevel 21/17/0 - 15.5 0.0 3.0 12.5  100.0% - - - 4 15.5 2  7.7 3  27.1  0.9 87.0 90.4 11.6  BiLevel 22/16/0 - 36.5 0.5 24.5 11.5  98.6% - - - 4 6.7 2  3.3 4  13.3  0.8 88.0 90.0 8.3  BiLevel 23/15/0 - 39.0 1.5 37.5 0.0  2.5 96.2% - - - 9 14.4 5  8.0 19  44.8  1.6 87.0 89.6 43.2  BiLevel 24/16/0 - 62.5 4.0 58.5 0.0  93.6% - - - 8 8.2 2  2.1 33  42.1  0.2 88.0 90.3 39.0  BiLevel 25/17/0 - 13.5 0.5 8.5 4.5  96.3% - - - 5 23.1 -  - -  23.1  0.4 88.0 90.9 9.2  BiLevel 26/18/0 - 13.0 0.0 0.0 13.0  100.0% - - - 2 9.2 2  9.2 1  13.8  0.0 89.0 90.9 4.6  BiLevel 27/19/0 - 30.0 7.5 6.5 16.0  2.5 75.0% - 1 - 8 24.0 -  2.7 5  37.3  0.0 89.0 92.1 26.7   Hypnograms                           Technologist Comments            The patient arrived for a CPAP titration study. The patient was placed in room 1. The patient has a history of CPAP use with nocturnal oxygen. The patient stated that insurance took the concentrator away and he is here to see if he needs it. The patient brought a large F&P Eson nasal mask with him. The mask looked in poor condition. The procedure was explained, and questions were answered.           CPAP was started at a pressure of 5 cm H2O and was increased to a pressure of 20 cm H2O. EPR was added at the final pressure of CPAP to help with comfort on the high pressure. The patient continued to have obstructive events, and due to the high pressure on CPAP, the patient was switched to BPAP. BPAP was started at a pressure of 21/17 cm H2O and was increased to 27/19 cm H2O. The patient tolerated the pressures well. A large F&P Eson 2 nasal mask was used during the titration.           The patient slept in the right and supine positions. Patient stated that a doctor told him not to sleep supine. Moderate PLMs and mild oral venting were observed. No bruxism, seizure, or spike wave activity was observed. No ECG events were observed. Snoring was mild and not audible.  All stages of sleep were recorded. REM was achieved at the final pressure, but not in the supine position. The patient had one-bathroom break.                            Reggy Salt Diplomate, Biomedical Engineer of Sleep Medicine  ELECTRONICALLY SIGNED ON:  07/08/2024, 1:09 PM [facility] PH: [phone]   FX: [phone] ACCREDITED BY THE [employer]ICAN AC[employer]Y OF SLEEP MEDICINE

## 2024-07-11 ENCOUNTER — Telehealth: Payer: Self-pay | Admitting: Nurse Practitioner

## 2024-07-11 DIAGNOSIS — G4733 Obstructive sleep apnea (adult) (pediatric): Secondary | ICD-10-CM

## 2024-07-11 NOTE — Telephone Encounter (Signed)
 Lm x1 for patient.

## 2024-07-13 NOTE — Telephone Encounter (Signed)
 Copied from CRM #[id number]. Topic: Clinical - Lab/Test Results >> Jul 11, 2024  4:56 PM Rozanna MATSU wrote: Reason for CRM: PT returning about sleep study and advised someone will reach on tomorrow to go over the results.

## 2024-07-14 NOTE — Telephone Encounter (Signed)
 Call and relayed results to pt - pt understood and confirmed understanding   Placing order for Bipap

## 2024-08-01 ENCOUNTER — Telehealth: Payer: Self-pay | Admitting: Nurse Practitioner

## 2024-08-01 NOTE — Telephone Encounter (Signed)
 I just received a message from [facility], they are needing office visit notes that show the patient trying and failing on cpap to go along with the cpap titration that was completed for this DME order.

## 2024-08-02 NOTE — Telephone Encounter (Signed)
 He was consistent with CPAP therapy. CPAP does NOT control his nocturnal hypoxia or his OSA. This is all documented in his CPAP titration study. It is also documented on an ONO on CPAP that he had persistent nocturnal hypoxia. They need to provide him with a BiPAP machine ASAP or we can switch DME companies. At this point, they are denying care to a patient despite appropriate documentation.

## 2024-08-02 NOTE — Telephone Encounter (Signed)
 I have informed [facility]. Currently waiting on response.

## 2024-08-03 NOTE — Telephone Encounter (Signed)
 Thanks, Sherlean! Appreciate your hard work on this!

## 2024-08-03 NOTE — Telephone Encounter (Signed)
 Hey sorry Psychiatric Nurse, I did get a message back  and they now see the CPAP titration interpretation and order. They stated they need one last thingwe just need the office visit note showing the tried and failed on cpap and we could not locate it in epic.

## 2024-08-03 NOTE — Telephone Encounter (Signed)
 I called Ky at [facility]. They confirmed everything is good with the order now. It is currently being processed, and it may take up to 14 business days for insurance to respond.  I then called Jake due 14 days being a long time for this to be approved. He will reach out to me once he is in the office. He is now aware of this patient and will provide more information regarding the issue. I'll keep you updated.

## 2024-08-03 NOTE — Telephone Encounter (Signed)
 There is not an OV note stating he tried and failed CPAP. He was doing well on CPAP and had a history of nocturnal hypoxia, despite CPAP, requiring supplemental O2. He required a renewal for this and therefore had an ONO which showed persistent hypoxia. This prompted the CPAP titration study which showed inadequate control on CPAP and showed he needed a BiPAP machine and is all documented in that procedural visit. They should not need anything else to get him a BiPAP machine.

## 2024-08-09 NOTE — Telephone Encounter (Signed)
 I have sent the signed CMN to [facility]. The order should be getting completed now. Thank you!

## 2024-08-31 ENCOUNTER — Telehealth: Payer: Self-pay

## 2024-08-31 NOTE — Telephone Encounter (Signed)
 Per DME, max IPAP is 25 cmH2O on their machine. Will trial pt on this as he as well controlled during study on 25/18 cmH2O. Changed order to auto IPAP max 25, EPAP min 14, PS 4.

## 2024-08-31 NOTE — Addendum Note (Signed)
 Addended by: Jay Haskew V on: 08/31/2024 11:01 AM   Modules accepted: Orders

## 2024-08-31 NOTE — Telephone Encounter (Signed)
 Pt needs to be resd due to not having 31 day full compliance on cpap - pls resdc with Alghan[facility] or virtual Appt with Drs at [employer]

## 2024-08-31 NOTE — Telephone Encounter (Signed)
 Patient rescheduled.

## 2024-09-01 ENCOUNTER — Ambulatory Visit: Payer: MEDICAID | Admitting: Nurse Practitioner

## 2024-10-30 ENCOUNTER — Encounter: Payer: MEDICAID | Admitting: Pulmonary Disease
